# Patient Record
Sex: Female | Born: 1990 | Race: Black or African American | Hispanic: No | Marital: Single | Smoking: Former smoker
Health system: Southern US, Community
[De-identification: ages and names within clinical notes are randomized; demographics above are authoritative.]

## PROBLEM LIST (undated history)

## (undated) DIAGNOSIS — Z789 Other specified health status: Secondary | ICD-10-CM

## (undated) DIAGNOSIS — D649 Anemia, unspecified: Secondary | ICD-10-CM

## (undated) DIAGNOSIS — Z3490 Encounter for supervision of normal pregnancy, unspecified, unspecified trimester: Secondary | ICD-10-CM

## (undated) DIAGNOSIS — A749 Chlamydial infection, unspecified: Secondary | ICD-10-CM

## (undated) DIAGNOSIS — Z309 Encounter for contraceptive management, unspecified: Secondary | ICD-10-CM

## (undated) HISTORY — DX: Other specified health status: Z78.9

## (undated) HISTORY — DX: Encounter for contraceptive management, unspecified: Z30.9

## (undated) HISTORY — DX: Encounter for supervision of normal pregnancy, unspecified, unspecified trimester: Z34.90

## (undated) HISTORY — DX: Anemia, unspecified: D64.9

## (undated) HISTORY — PX: NO PAST SURGERIES: SHX2092

---
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
## 2011-09-14 IMAGING — CR DG CHEST 2V
2 series · 2 of 2 positions shown · non-contrast
Comparison: None.

CLINICAL DATA: Left chest pain and heaviness.

CHEST - 2 VIEW

[view not recorded (1 of 2)]
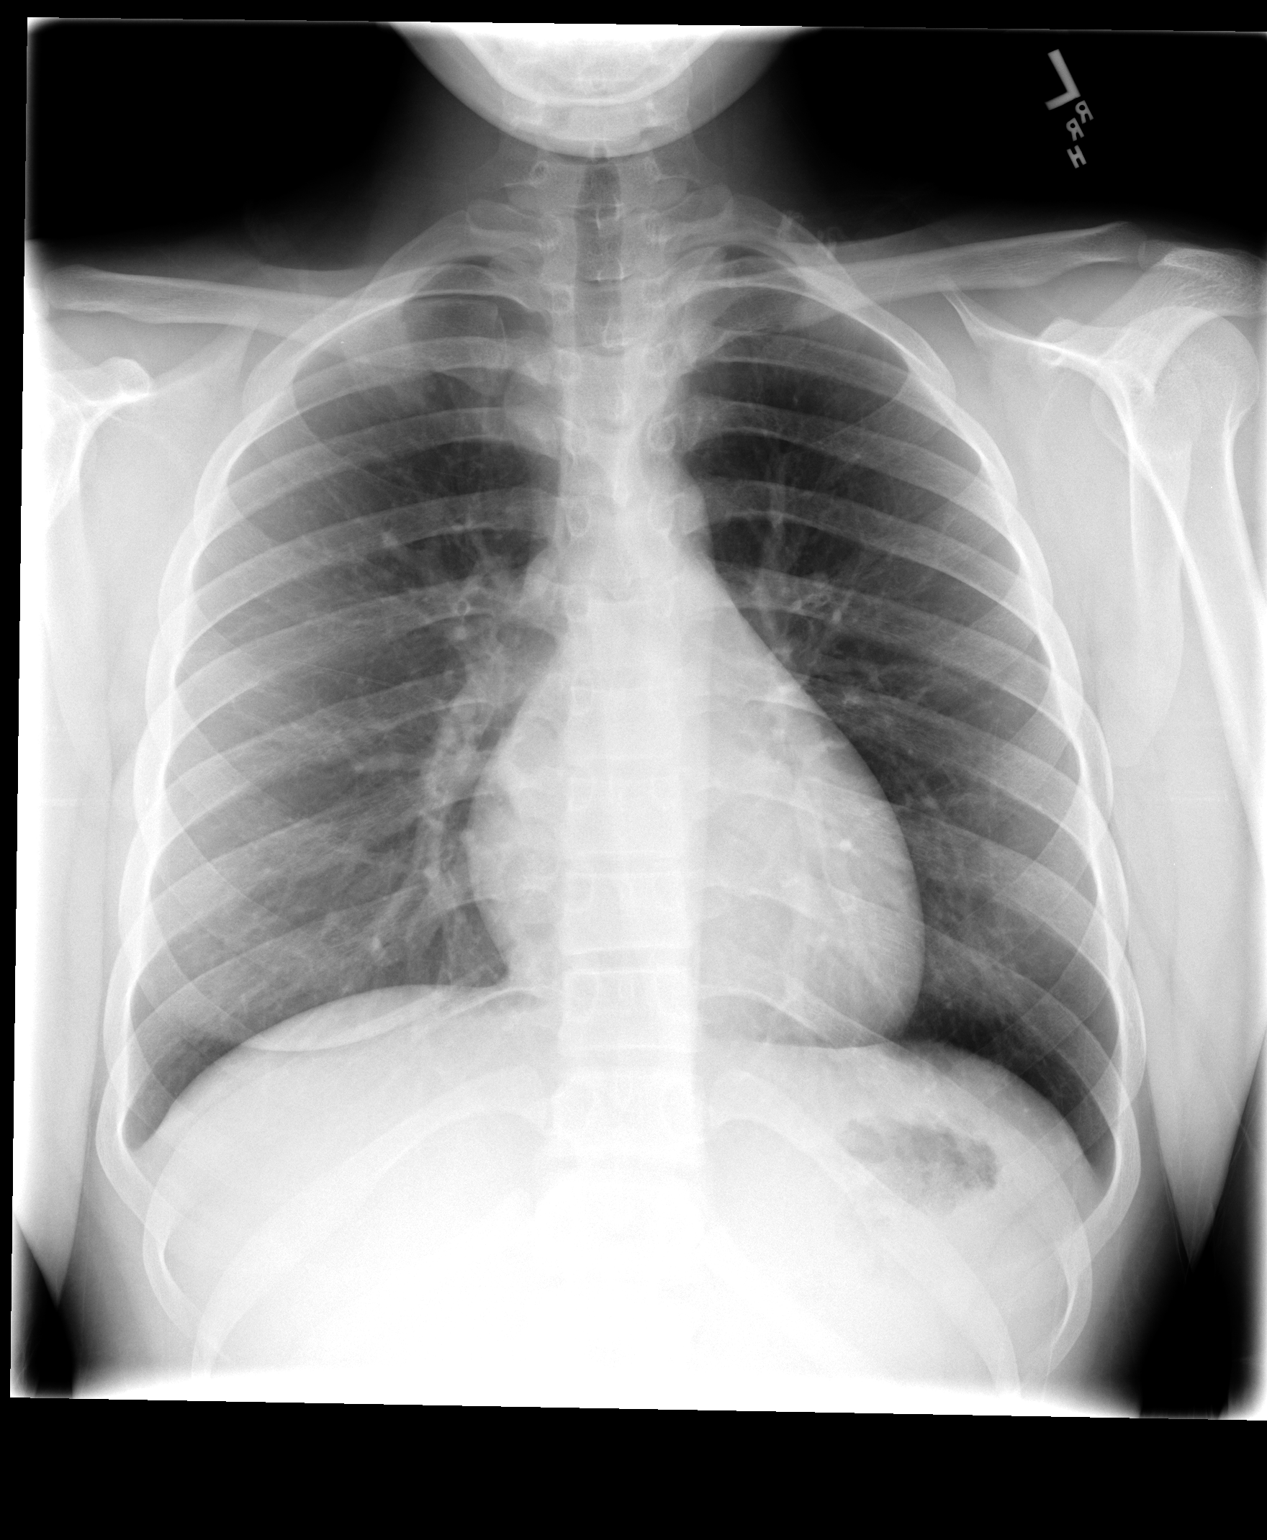

[view not recorded (2 of 2)]
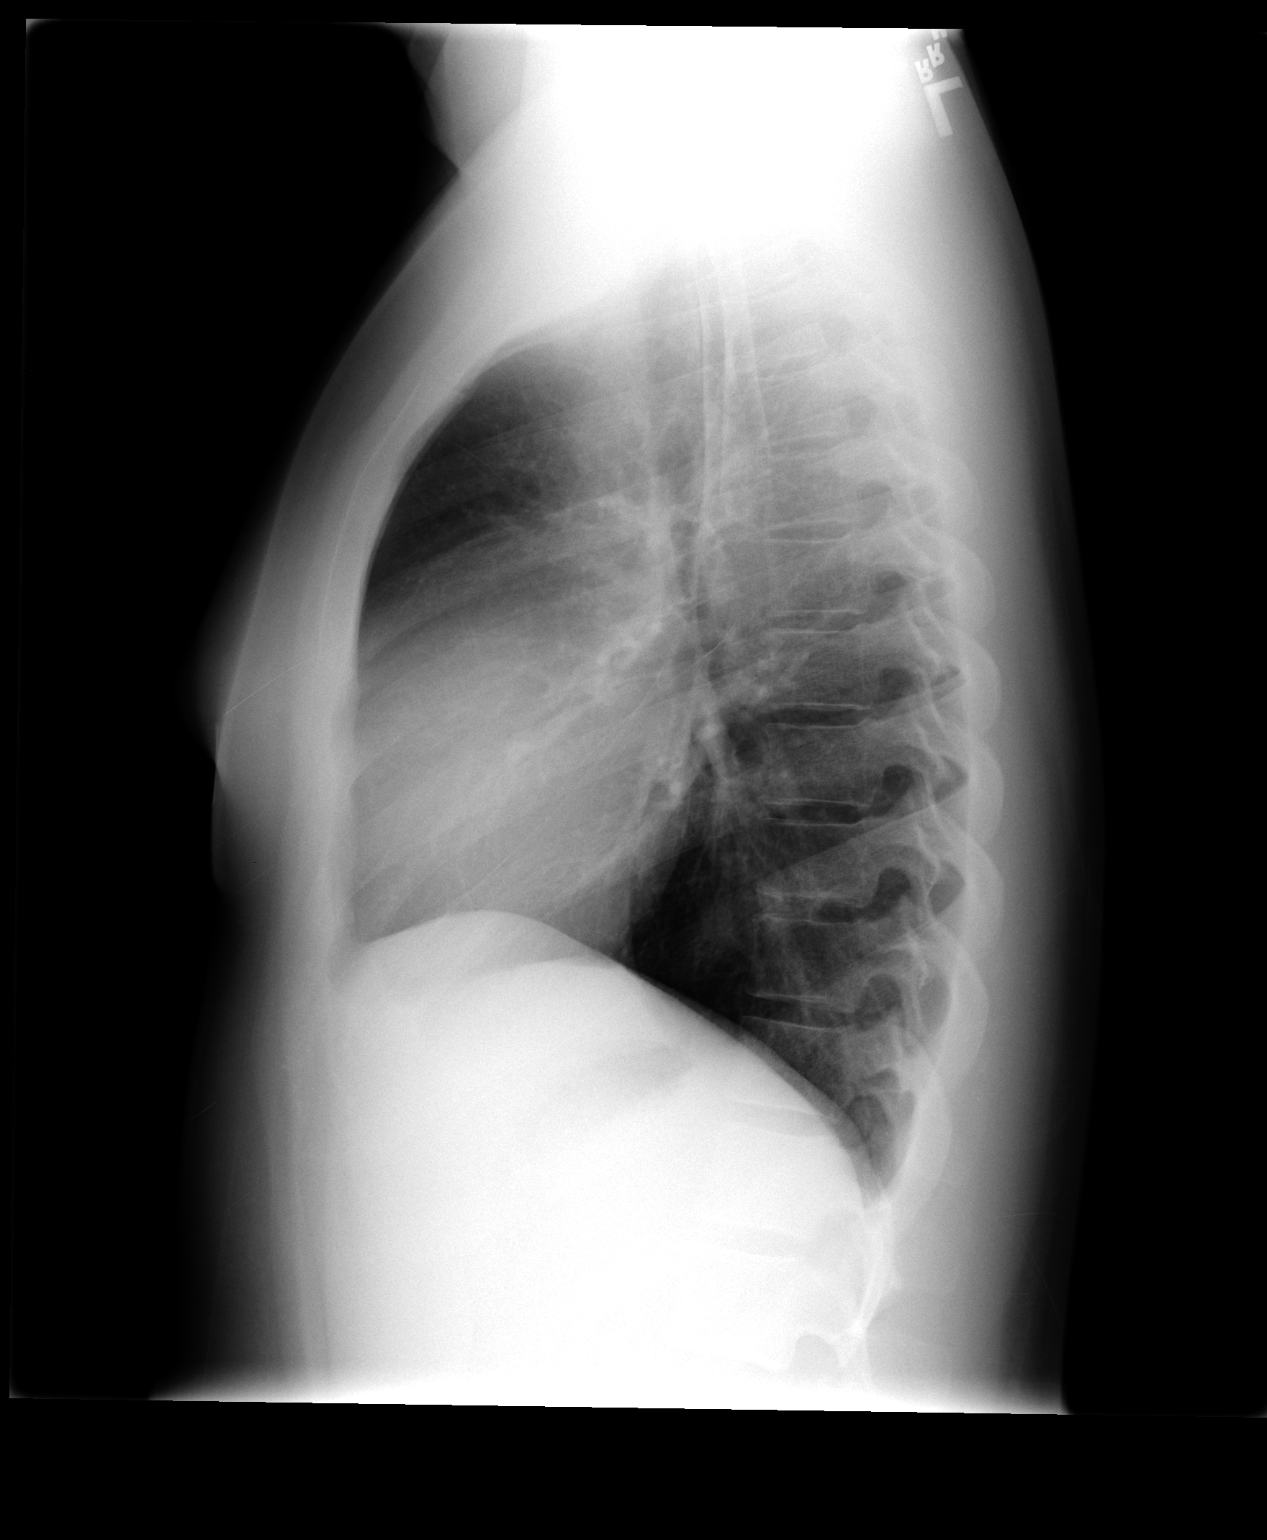

[2 of 2 positions shown; findings below may reference images not displayed]

FINDINGS: Trachea is midline.  Heart size normal.  The lungs are
clear.  No pleural fluid.
IMPRESSION: No acute findings.

## 2013-06-16 ENCOUNTER — Emergency Department (HOSPITAL_COMMUNITY)
Admission: EM | Admit: 2013-06-16 | Discharge: 2013-06-16 | Disposition: A | Discharge status: Home / Self Care | Payer: Medicaid Other | Attending: Emergency Medicine | Admitting: Emergency Medicine

## 2013-06-16 ENCOUNTER — Encounter (HOSPITAL_COMMUNITY): Payer: Self-pay | Admitting: Emergency Medicine

## 2013-06-16 DIAGNOSIS — K089 Disorder of teeth and supporting structures, unspecified: Secondary | ICD-10-CM | POA: Insufficient documentation

## 2013-06-16 DIAGNOSIS — F17200 Nicotine dependence, unspecified, uncomplicated: Secondary | ICD-10-CM | POA: Insufficient documentation

## 2013-06-16 DIAGNOSIS — K0889 Other specified disorders of teeth and supporting structures: Secondary | ICD-10-CM

## 2013-06-16 MED ORDER — HYDROCODONE-ACETAMINOPHEN 5-325 MG PO TABS: 1.0000 | ORAL_TABLET | Freq: Four times a day (QID) | ORAL | Status: DC | PRN

## 2013-06-16 MED ORDER — PENICILLIN V POTASSIUM 500 MG PO TABS: 500.0000 mg | ORAL_TABLET | Freq: Three times a day (TID) | ORAL | Status: DC

## 2013-06-16 NOTE — ED Provider Notes (Signed)
CSN: [id number]     Arrival date & time 06/16/13  1810 History   First MD Initiated Contact with Patient 06/16/13 1931     Chief Complaint  Patient presents with  . Dental Pain   (Consider location/radiation/quality/duration/timing/severity/associated sxs/prior Treatment) Patient is a 23 y.o. female presenting with tooth pain. The history is provided by the patient. No language interpreter was used.  Dental Pain Location:  Lower Quality:  Throbbing Severity:  Severe Onset quality:  Gradual Duration:  2 weeks Timing:  Constant Progression:  Worsening Chronicity:  New Context: dental caries   Previous work-up:  Filled cavity Ineffective treatments:  NSAIDs Associated symptoms: no difficulty swallowing     History reviewed. No pertinent past medical history. History reviewed. No pertinent past surgical history. History reviewed. No pertinent family history. History  Substance Use Topics  . Smoking status: Current Every Day Smoker  . Smokeless tobacco: Not on file  . Alcohol Use: Yes   OB History   Grav Para Term Preterm Abortions TAB SAB Ect Mult Living                 Review of Systems  HENT: Positive for dental problem.   All other systems reviewed and are negative.    Allergies  Review of patient's allergies indicates no known allergies.  Home Medications  No current outpatient prescriptions on file. BP 123/87  Pulse 67  Temp(Src) 98.6 F (37 C) (Oral)  Resp 18  Ht 5\' 7"  (1.702 m)  Wt 153 lb (69.4 kg)  BMI 23.96 kg/m2  SpO2 99%  LMP 06/14/2013 Physical Exam  Nursing note and vitals reviewed. Constitutional: She is oriented to person, place, and time. She appears well-developed and well-nourished.  HENT:  Head: Normocephalic.  Mouth/Throat:    Eyes: Pupils are equal, round, and reactive to light.  Neck: Normal range of motion.  Cardiovascular: Normal rate.   Pulmonary/Chest: Effort normal and breath sounds normal.  Abdominal: Soft.    Musculoskeletal: Normal range of motion.  Lymphadenopathy:    She has no cervical adenopathy.  Neurological: She is alert and oriented to person, place, and time.  Skin: Skin is warm and dry.  Psychiatric: She has a normal mood and affect. Her behavior is normal. Judgment and thought content normal.    ED Course  Procedures (including critical care time) Labs Review Labs Reviewed - No data to display Imaging Review No results found.  EKG Interpretation   None      Patient has a dentist but has not yet scheduled an appointment.  MDM  Dental pain.    Jimmye Normanavid John Nabeel Gladson, NP 06/16/13 2217

## 2013-06-16 NOTE — Discharge Instructions (Signed)

## 2013-06-16 NOTE — ED Notes (Signed)
Dental pain for 2 weeks.

## 2013-06-16 NOTE — ED Provider Notes (Signed)
Medical screening examination/treatment/procedure(s) were performed by non-physician practitioner and as supervising physician I was immediately available for consultation/collaboration.  EKG Interpretation   None         Elhadji Pecore L Crisol Muecke, MD 06/16/13 2342 

## 2013-12-27 ENCOUNTER — Ambulatory Visit (INDEPENDENT_AMBULATORY_CARE_PROVIDER_SITE_OTHER): Payer: Medicaid Other

## 2013-12-27 ENCOUNTER — Other Ambulatory Visit: Payer: Self-pay | Admitting: Obstetrics & Gynecology

## 2013-12-27 DIAGNOSIS — O3680X1 Pregnancy with inconclusive fetal viability, fetus 1: Secondary | ICD-10-CM

## 2013-12-27 DIAGNOSIS — O3090 Multiple gestation, unspecified, unspecified trimester: Secondary | ICD-10-CM

## 2013-12-27 DIAGNOSIS — O3680X0 Pregnancy with inconclusive fetal viability, not applicable or unspecified: Secondary | ICD-10-CM

## 2013-12-27 NOTE — Progress Notes (Signed)
Dating US completed today. Patient unsure of LMP (sometime in March).  Single, active female fetus in a breech presentation.  Cervix closed measuring 3.1 cm.  Anterior Gr 0 placenta. Fluid is subjectively normal with a SVP of 3.41 cm. FHR 149.  Left ovary appears normal. Right ovary not visualized.  Measurements give an EDD of 06-20-14 (15+0 weeks today).  EFW 105 g (4oz).  Anatomy limited today by early gestational age.

## 2014-01-01 ENCOUNTER — Encounter: Payer: Medicaid Other | Admitting: Women's Health

## 2014-01-01 ENCOUNTER — Encounter: Payer: Self-pay | Admitting: *Deleted

## 2014-01-10 ENCOUNTER — Ambulatory Visit (INDEPENDENT_AMBULATORY_CARE_PROVIDER_SITE_OTHER): Payer: Medicaid Other | Admitting: Women's Health

## 2014-01-10 ENCOUNTER — Other Ambulatory Visit (HOSPITAL_COMMUNITY)
Admission: RE | Admit: 2014-01-10 | Discharge: 2014-01-10 | Disposition: A | Discharge status: Home / Self Care | Payer: Medicaid Other | Source: Ambulatory Visit | Attending: Obstetrics & Gynecology | Admitting: Obstetrics & Gynecology

## 2014-01-10 ENCOUNTER — Encounter: Payer: Self-pay | Admitting: Women's Health

## 2014-01-10 VITALS — BP 108/72 | Ht 61.0 in | Wt 165.0 lb

## 2014-01-10 DIAGNOSIS — Z0184 Encounter for antibody response examination: Secondary | ICD-10-CM

## 2014-01-10 DIAGNOSIS — Z3490 Encounter for supervision of normal pregnancy, unspecified, unspecified trimester: Secondary | ICD-10-CM | POA: Insufficient documentation

## 2014-01-10 DIAGNOSIS — Z130 Encounter for screening for diseases of the blood and blood-forming organs and certain disorders involving the immune mechanism: Secondary | ICD-10-CM

## 2014-01-10 DIAGNOSIS — Z3400 Encounter for supervision of normal first pregnancy, unspecified trimester: Secondary | ICD-10-CM

## 2014-01-10 DIAGNOSIS — Z36 Encounter for antenatal screening of mother: Secondary | ICD-10-CM

## 2014-01-10 DIAGNOSIS — Z1389 Encounter for screening for other disorder: Secondary | ICD-10-CM

## 2014-01-10 DIAGNOSIS — Z01419 Encounter for gynecological examination (general) (routine) without abnormal findings: Secondary | ICD-10-CM

## 2014-01-10 DIAGNOSIS — Z331 Pregnant state, incidental: Secondary | ICD-10-CM

## 2014-01-10 DIAGNOSIS — Z3402 Encounter for supervision of normal first pregnancy, second trimester: Secondary | ICD-10-CM

## 2014-01-10 DIAGNOSIS — Z0189 Encounter for other specified special examinations: Secondary | ICD-10-CM

## 2014-01-10 DIAGNOSIS — Z1159 Encounter for screening for other viral diseases: Secondary | ICD-10-CM

## 2014-01-10 DIAGNOSIS — Z1371 Encounter for nonprocreative screening for genetic disease carrier status: Secondary | ICD-10-CM

## 2014-01-10 LAB — CBC
HCT: 36.1 % (ref 36.0–46.0)
Hemoglobin: 11.6 g/dL — ABNORMAL LOW (ref 12.0–15.0)
MCH: 26.4 pg (ref 26.0–34.0)
MCHC: 32.1 g/dL (ref 30.0–36.0)
MCV: 82.2 fL (ref 78.0–100.0)
Platelets: 326 10*3/uL (ref 150–400)
RBC: 4.39 MIL/uL (ref 3.87–5.11)
RDW: 15.5 % (ref 11.5–15.5)
WBC: 9.1 10*3/uL (ref 4.0–10.5)

## 2014-01-10 NOTE — Patient Instructions (Signed)
Second Trimester of Pregnancy The second trimester is from week 13 through week 28, months 4 through 6. The second trimester is often a time when you feel your best. Your body has also adjusted to being pregnant, and you begin to feel better physically. Usually, morning sickness has lessened or quit completely, you may have more energy, and you may have an increase in appetite. The second trimester is also a time when the fetus is growing rapidly. At the end of the sixth month, the fetus is about 9 inches long and weighs about 1 pounds. You will likely begin to feel the baby move (quickening) between 18 and 20 weeks of the pregnancy. BODY CHANGES Your body goes through many changes during pregnancy. The changes vary from woman to woman.   Your weight will continue to increase. You will notice your lower abdomen bulging out.  You may begin to get stretch marks on your hips, abdomen, and breasts.  You may develop headaches that can be relieved by medicines approved by your health care provider.  You may urinate more often because the fetus is pressing on your bladder.  You may develop or continue to have heartburn as a result of your pregnancy.  You may develop constipation because certain hormones are causing the muscles that push waste through your intestines to slow down.  You may develop hemorrhoids or swollen, bulging veins (varicose veins).  You may have back pain because of the weight gain and pregnancy hormones relaxing your joints between the bones in your pelvis and as a result of a shift in weight and the muscles that support your balance.  Your breasts will continue to grow and be tender.  Your gums may bleed and may be sensitive to brushing and flossing.  Dark spots or blotches (chloasma, mask of pregnancy) may develop on your face. This will likely fade after the baby is born.  A dark line from your belly button to the pubic area (linea nigra) may appear. This will likely fade  after the baby is born.  You may have changes in your hair. These can include thickening of your hair, rapid growth, and changes in texture. Some women also have hair loss during or after pregnancy, or hair that feels dry or thin. Your hair will most likely return to normal after your baby is born. WHAT TO EXPECT AT YOUR PRENATAL VISITS During a routine prenatal visit:  You will be weighed to make sure you and the fetus are growing normally.  Your blood pressure will be taken.  Your abdomen will be measured to track your baby's growth.  The fetal heartbeat will be listened to.  Any test results from the previous visit will be discussed. Your health care provider may ask you:  How you are feeling.  If you are feeling the baby move.  If you have had any abnormal symptoms, such as leaking fluid, bleeding, severe headaches, or abdominal cramping.  If you have any questions. Other tests that may be performed during your second trimester include:  Blood tests that check for:  Low iron levels (anemia).  Gestational diabetes (between 24 and 28 weeks).  Rh antibodies.  Urine tests to check for infections, diabetes, or protein in the urine.  An ultrasound to confirm the proper growth and development of the baby.  An amniocentesis to check for possible genetic problems.  Fetal screens for spina bifida and Down syndrome. HOME CARE INSTRUCTIONS   Avoid all smoking, herbs, alcohol, and unprescribed   drugs. These chemicals affect the formation and growth of the baby.  Follow your health care provider's instructions regarding medicine use. There are medicines that are either safe or unsafe to take during pregnancy.  Exercise only as directed by your health care provider. Experiencing uterine cramps is a good sign to stop exercising.  Continue to eat regular, healthy meals.  Wear a good support bra for breast tenderness.  Do not use hot tubs, steam rooms, or saunas.  Wear your  seat belt at all times when driving.  Avoid raw meat, uncooked cheese, cat litter boxes, and soil used by cats. These carry germs that can cause birth defects in the baby.  Take your prenatal vitamins.  Try taking a stool softener (if your health care provider approves) if you develop constipation. Eat more high-fiber foods, such as fresh vegetables or fruit and whole grains. Drink plenty of fluids to keep your urine clear or pale yellow.  Take warm sitz baths to soothe any pain or discomfort caused by hemorrhoids. Use hemorrhoid cream if your health care provider approves.  If you develop varicose veins, wear support hose. Elevate your feet for 15 minutes, 3-4 times a day. Limit salt in your diet.  Avoid heavy lifting, wear low heel shoes, and practice good posture.  Rest with your legs elevated if you have leg cramps or low back pain.  Visit your dentist if you have not gone yet during your pregnancy. Use a soft toothbrush to brush your teeth and be gentle when you floss.  A sexual relationship may be continued unless your health care provider directs you otherwise.  Continue to go to all your prenatal visits as directed by your health care provider. SEEK MEDICAL CARE IF:   You have dizziness.  You have mild pelvic cramps, pelvic pressure, or nagging pain in the abdominal area.  You have persistent nausea, vomiting, or diarrhea.  You have a bad smelling vaginal discharge.  You have pain with urination. SEEK IMMEDIATE MEDICAL CARE IF:   You have a fever.  You are leaking fluid from your vagina.  You have spotting or bleeding from your vagina.  You have severe abdominal cramping or pain.  You have rapid weight gain or loss.  You have shortness of breath with chest pain.  You notice sudden or extreme swelling of your face, hands, ankles, feet, or legs.  You have not felt your baby move in over an hour.  You have severe headaches that do not go away with  medicine.  You have vision changes. Document Released: 05/05/2001 Document Revised: 05/16/2013 Document Reviewed: 07/12/2012 Exit[employer] Patient Information 2015 [employer]. This information is not intended to replace advice given to you by your health care provider. Make sure you discuss any questions you have with your health care provider.  

## 2014-01-10 NOTE — Progress Notes (Signed)
  Subjective:  Brooke Hunt is a 23 y.o. G1P0 African American female at 5860w0d by 15wk u/s, being seen today for her first obstetrical visit.  Her obstetrical history is significant for smoker-1/2ppd down to 5-6/day, late care @ 17wks.  Pregnancy history fully reviewed.  Patient reports no complaints. Denies vb, cramping, uti s/s, abnormal/malodorous vag d/c, or vulvovaginal itching/irritation.  BP 108/72  Wt 165 lb (74.844 kg)  LMP 08/16/2013  HISTORY: OB History  Gravida Para Term Preterm AB SAB TAB Ectopic Multiple Living  1             # Outcome Date GA Lbr Len/2nd Weight Sex Delivery Anes PTL Lv  1 CUR              Past Medical History  Diagnosis Date  . Medical history non-contributory    Past Surgical History  Procedure Laterality Date  . No past surgeries     Family History  Problem Relation Age of Onset  . Arthritis Maternal Grandmother     Exam   System:     General: Well developed & nourished, no acute distress   Skin: Warm & dry, normal coloration and turgor, no rashes   Neurologic: Alert & oriented, normal mood   Cardiovascular: Regular rate & rhythm   Respiratory: Effort & rate normal, LCTAB, acyanotic   Abdomen: Soft, non tender   Extremities: normal strength, tone   Pelvic Exam:    Perineum: Normal perineum   Vulva: Normal, no lesions   Vagina:  Normal mucosa, normal discharge   Cervix: Normal, bulbous, appears closed   Uterus: Normal size/shape/contour for GA   Thin prep pap smear obtained w/ reflex high risk HPV cotesting FHR: 147 via doppler   Assessment:   Pregnancy: G1P0 Patient Active Problem List   Diagnosis Date Noted  . Supervision of normal first pregnancy 01/10/2014    Priority: High    5860w0d G1P0 New OB visit Smoker Late pnc    Plan:  Initial labs drawn Continue prenatal vitamins Problem list reviewed and updated Reviewed n/v relief measures and warning s/s to report Reviewed recommended weight gain based on  pre-gravid BMI Encouraged well-balanced diet Genetic Screening discussed Quad Screen: requested and ordered Cystic fibrosis screening discussed declined Ultrasound discussed; fetal survey: requested Follow up in 3 weeks for anatomy u/s and visit [facility] completed NFPartnership referral sent  Marge DuncansBooker, Aldair Rickel Randall CNM, [facility]NP-BC 01/10/2014 2:17 PM

## 2014-01-11 LAB — RUBELLA SCREEN: Rubella: 1.12 Index — ABNORMAL HIGH (ref <0.90)

## 2014-01-11 LAB — URINALYSIS, ROUTINE W REFLEX MICROSCOPIC
Bilirubin Urine: NEGATIVE
Glucose, UA: NEGATIVE mg/dL
Hgb urine dipstick: NEGATIVE
Ketones, ur: NEGATIVE mg/dL
Leukocytes, UA: NEGATIVE
Nitrite: NEGATIVE
Protein, ur: NEGATIVE mg/dL
Specific Gravity, Urine: 1.025 (ref 1.005–1.030)
Urobilinogen, UA: 1 mg/dL (ref 0.0–1.0)
pH: 7 (ref 5.0–8.0)

## 2014-01-11 LAB — DRUG SCREEN, URINE, NO CONFIRMATION
Amphetamine Screen, Ur: NEGATIVE
Barbiturate Quant, Ur: NEGATIVE
Benzodiazepines.: NEGATIVE
Cocaine Metabolites: NEGATIVE
Creatinine,U: 227.5 mg/dL
Marijuana Metabolite: POSITIVE — AB
Methadone: NEGATIVE
Opiate Screen, Urine: NEGATIVE
Phencyclidine (PCP): NEGATIVE
Propoxyphene: NEGATIVE

## 2014-01-11 LAB — SICKLE CELL SCREEN: Sickle Cell Screen: NEGATIVE

## 2014-01-11 LAB — CYSTIC FIBROSIS DIAGNOSTIC STUDY

## 2014-01-11 LAB — HIV ANTIBODY (ROUTINE TESTING W REFLEX): HIV 1&2 Ab, 4th Generation: NONREACTIVE

## 2014-01-11 LAB — ABO AND RH: Rh Type: POSITIVE

## 2014-01-11 LAB — ANTIBODY SCREEN: Antibody Screen: NEGATIVE

## 2014-01-11 LAB — OXYCODONE SCREEN, UA, RFLX CONFIRM: Oxycodone Screen, Ur: NEGATIVE ng/mL

## 2014-01-11 LAB — HEPATITIS B SURFACE ANTIGEN: Hepatitis B Surface Ag: NEGATIVE

## 2014-01-11 LAB — RPR

## 2014-01-11 LAB — VARICELLA ZOSTER ANTIBODY, IGG: Varicella IgG: <10 Index (ref <135.00)

## 2014-01-12 LAB — AFP, QUAD SCREEN
AFP: 47.8 IU/mL
Age Alone: 1:1140 {titer}
Curr Gest Age: 17 wks.days
Down Syndrome Scr Risk Est: 1:23300 {titer}
HCG, Total: 35812 m[IU]/mL
INH: 262.3 pg/mL
Interpretation-AFP: NEGATIVE
MoM for AFP: 1.31
MoM for INH: 1.52
MoM for hCG: 1.7
Open Spina bifida: NEGATIVE
Osb Risk: 1:9620 {titer}
Tri 18 Scr Risk Est: NEGATIVE
Trisomy 18 (Edward) Syndrome Interp.: 1:172000 {titer}
uE3 Mom: 1.17
uE3 Value: 0.7 ng/mL

## 2014-01-12 LAB — CYTOLOGY - PAP

## 2014-01-12 LAB — URINE CULTURE: Colony Count: 4000

## 2014-01-20 ENCOUNTER — Encounter: Payer: Self-pay | Admitting: Women's Health

## 2014-01-20 DIAGNOSIS — Z2839 Other underimmunization status: Secondary | ICD-10-CM | POA: Insufficient documentation

## 2014-01-20 DIAGNOSIS — O09899 Supervision of other high risk pregnancies, unspecified trimester: Secondary | ICD-10-CM | POA: Insufficient documentation

## 2014-01-20 DIAGNOSIS — Z283 Underimmunization status: Secondary | ICD-10-CM

## 2014-02-01 ENCOUNTER — Other Ambulatory Visit: Payer: Self-pay | Admitting: Women's Health

## 2014-02-01 ENCOUNTER — Encounter: Payer: Self-pay | Admitting: Advanced Practice Midwife

## 2014-02-01 ENCOUNTER — Ambulatory Visit (INDEPENDENT_AMBULATORY_CARE_PROVIDER_SITE_OTHER): Payer: Medicaid Other | Admitting: Advanced Practice Midwife

## 2014-02-01 ENCOUNTER — Ambulatory Visit (INDEPENDENT_AMBULATORY_CARE_PROVIDER_SITE_OTHER): Payer: Medicaid Other

## 2014-02-01 VITALS — BP 120/84 | Wt 161.0 lb

## 2014-02-01 DIAGNOSIS — Z3402 Encounter for supervision of normal first pregnancy, second trimester: Secondary | ICD-10-CM

## 2014-02-01 DIAGNOSIS — F1210 Cannabis abuse, uncomplicated: Secondary | ICD-10-CM

## 2014-02-01 DIAGNOSIS — O355XX1 Maternal care for (suspected) damage to fetus by drugs, fetus 1: Secondary | ICD-10-CM

## 2014-02-01 DIAGNOSIS — O0932 Supervision of pregnancy with insufficient antenatal care, second trimester: Secondary | ICD-10-CM

## 2014-02-01 DIAGNOSIS — Z1389 Encounter for screening for other disorder: Secondary | ICD-10-CM

## 2014-02-01 DIAGNOSIS — Z3400 Encounter for supervision of normal first pregnancy, unspecified trimester: Secondary | ICD-10-CM

## 2014-02-01 DIAGNOSIS — O3090 Multiple gestation, unspecified, unspecified trimester: Secondary | ICD-10-CM

## 2014-02-01 DIAGNOSIS — Z331 Pregnant state, incidental: Secondary | ICD-10-CM

## 2014-02-01 DIAGNOSIS — O355XX0 Maternal care for (suspected) damage to fetus by drugs, not applicable or unspecified: Secondary | ICD-10-CM

## 2014-02-01 DIAGNOSIS — O0930 Supervision of pregnancy with insufficient antenatal care, unspecified trimester: Secondary | ICD-10-CM

## 2014-02-01 LAB — POCT URINALYSIS DIPSTICK
Blood, UA: NEGATIVE
Blood, UA: NEGATIVE
GLUCOSE UA: NEGATIVE
Glucose, UA: NEGATIVE
Ketones, UA: NEGATIVE
Ketones, UA: NEGATIVE
LEUKOCYTES UA: NEGATIVE
Leukocytes, UA: NEGATIVE
Nitrite, UA: NEGATIVE
PROTEIN UA: NEGATIVE
Protein, UA: NEGATIVE

## 2014-02-01 NOTE — Progress Notes (Signed)
G1P0 [redacted]w[redacted]d Estimated Date of Delivery: 06/20/14  Blood pressure 120/84, weight 161 lb (73.029 kg), last menstrual period 08/16/2013.   BP weight and urine results all reviewed and noted.  Please refer to the obstetrical flow sheet for the fundal height and fetal heart rate documentation: Had anatomy scan today, all normal, female.  Eating a lot more healthy, quit fast food  Patient reports denies any bleeding and no rupture of membranes symptoms or regular contractions. Patient is without complaints. All questions were answered.  Plan:  Continued routine obstetrical care,  Follow up in 4 weeks for OB appointment,

## 2014-02-01 NOTE — Progress Notes (Signed)
U/S(20+1wks)-active fetus, meas c/w dates, fluid WNL, anterior Gr 0 placenta, cx appears closed (3.2cm), bilateral adnexa appears WNL, FHR- 150 bpm, female fetus, no major abnl noted

## 2014-02-01 NOTE — Progress Notes (Signed)
Pt denies any problem or concerns at this time.

## 2014-02-02 LAB — DRUG SCREEN, URINE, NO CONFIRMATION
Amphetamine Screen, Ur: NEGATIVE
BENZODIAZEPINES.: NEGATIVE
Barbiturate Quant, Ur: NEGATIVE
COCAINE METABOLITES: NEGATIVE
Creatinine,U: 108.1 mg/dL
Marijuana Metabolite: POSITIVE — AB
Methadone: NEGATIVE
Opiate Screen, Urine: NEGATIVE
PHENCYCLIDINE (PCP): NEGATIVE
Propoxyphene: NEGATIVE

## 2014-02-02 LAB — GC/CHLAMYDIA PROBE AMP
CT Probe RNA: POSITIVE — AB
GC Probe RNA: NEGATIVE

## 2014-02-06 ENCOUNTER — Other Ambulatory Visit: Payer: Self-pay | Admitting: Advanced Practice Midwife

## 2014-02-06 ENCOUNTER — Encounter: Payer: Self-pay | Admitting: Advanced Practice Midwife

## 2014-02-06 DIAGNOSIS — A749 Chlamydial infection, unspecified: Secondary | ICD-10-CM

## 2014-02-06 MED ORDER — AZITHROMYCIN 500 MG PO TABS: 1000.0000 mg | ORAL_TABLET | Freq: Once | ORAL | Status: DC

## 2014-03-01 ENCOUNTER — Encounter: Payer: Self-pay | Admitting: *Deleted

## 2014-03-01 ENCOUNTER — Encounter: Payer: Medicaid Other | Admitting: Advanced Practice Midwife

## 2014-03-07 ENCOUNTER — Ambulatory Visit (INDEPENDENT_AMBULATORY_CARE_PROVIDER_SITE_OTHER): Payer: Medicaid Other | Admitting: Advanced Practice Midwife

## 2014-03-07 ENCOUNTER — Encounter: Payer: Self-pay | Admitting: Advanced Practice Midwife

## 2014-03-07 VITALS — BP 124/66 | Wt 176.0 lb

## 2014-03-07 DIAGNOSIS — Z1389 Encounter for screening for other disorder: Secondary | ICD-10-CM

## 2014-03-07 DIAGNOSIS — Z8619 Personal history of other infectious and parasitic diseases: Secondary | ICD-10-CM

## 2014-03-07 DIAGNOSIS — Z3402 Encounter for supervision of normal first pregnancy, second trimester: Secondary | ICD-10-CM

## 2014-03-07 DIAGNOSIS — Z331 Pregnant state, incidental: Secondary | ICD-10-CM

## 2014-03-07 LAB — POCT URINALYSIS DIPSTICK
Blood, UA: NEGATIVE
Glucose, UA: NEGATIVE
Ketones, UA: NEGATIVE
Leukocytes, UA: NEGATIVE
Nitrite, UA: NEGATIVE
Protein, UA: NEGATIVE

## 2014-03-07 MED ORDER — OMEPRAZOLE 20 MG PO CPDR: 20.0000 mg | DELAYED_RELEASE_CAPSULE | Freq: Every day | ORAL | Status: DC

## 2014-03-07 NOTE — Progress Notes (Signed)
Pt states that she has been having bad heart burn and reflux. Pt denies any other problems or concerns at this time.

## 2014-03-07 NOTE — Progress Notes (Signed)
G1P0 6058w0d Estimated Date of Delivery: 06/20/14  Blood pressure 124/66, weight 176 lb (79.833 kg), last menstrual period 08/16/2013.   BP weight and urine results all reviewed and noted.  Please refer to the obstetrical flow sheet for the fundal height and fetal heart rate documentation:  Patient reports good fetal movement, denies any bleeding and no rupture of membranes symptoms or regular contractions. Patient c/o heartburn and reflux All questions were answered.  Plan:  Continued routine obstetrical care, prilosce for reflux  Follow up in 3 weeks for OB appointment, PN2

## 2014-03-07 NOTE — Patient Instructions (Signed)
1. Before your test, do not eat or drink anything for 8-10 hours prior to your  appointment (a small amount of water is allowed and you may take any medicines you normally take). Be sure to drink lots of water the day before. 2. When you arrive, your blood will be drawn for a 'fasting' blood sugar level.  Then you will be given a sweetened carbonated beverage to drink. You should  complete drinking this beverage within five minutes. After finishing the  beverage, you will have your blood drawn exactly 1 and 2 hours later. Having  your blood drawn on time is an important part of this test. A total of three blood  samples will be done. 3. The test takes approximately 2  hours. During the test, do not have anything to  eat or drink. Do not smoke, chew gum (not even sugarless gum) or use breath mints.  4. During the test you should remain close by and seated as much as possible and  avoid walking around. You may want to bring a book or something else to  occupy your time.  5. After your test, you may eat and drink as normal. You may want to bring a snack  to eat after the test is finished. Your provider will advise you as to the results of  this test and any follow-up if necessary  You will also be retested for syphilis, HIV and blood levels (anemia):  You were already tested in the first trimester, but [place] recommends retesting.  Additionally, you will be tested for Type 2 Herpes. MOST people do not know that they have genital herpes, as only around 15% of people have outbreaks.  However, it is still transmittable to other people, including the baby (but only during the birth).  If you test positive for Type 2 Herpes, we place you on a medicine called acyclovir the last 6 weeks of your pregnancy to prevent transmission of the virus to the baby during the birth.    If your sugar test is positive for gestational diabetes, you will be given an phone call and further instructions discussed.   We typically do not call patients with positive herpes results, but will discuss it at your next appointment.  If you wish to know all of your test results before your next appointment, feel free to call the office, or look up your test results on Mychart.  (The range that the lab uses for normal values of the sugar test are not necessarily the range that is used for pregnant women; if your results are within the range, they are definitely normal.  However, if a value is deemed "high" by the lab, it may not be too high for a pregnant woman.  We will need to discuss the normal range if your value(s) fall in the "high" category).     Sometime between 27 and 36 weeks, it is recommended that you and anyone who is going to be in close contact with your baby receive the Tdap booster.  You should receive it EACH pregnancy, regardless of when your last booster was.  You may go to the [facility] Department (no appointment necessary) or your Primary Care office to receive the vaccine.  If you do not receive the vaccine prior to delivery, it will be offered in the hospital.  However, if you get it at least 2 weeks prior to delivery, you will have the added advantage of passing the immunity to your baby.   

## 2014-03-08 LAB — GC/CHLAMYDIA PROBE AMP
CT Probe RNA: POSITIVE — AB
GC Probe RNA: NEGATIVE

## 2014-03-13 ENCOUNTER — Encounter: Payer: Self-pay | Admitting: Advanced Practice Midwife

## 2014-03-13 ENCOUNTER — Other Ambulatory Visit: Payer: Self-pay | Admitting: Advanced Practice Midwife

## 2014-03-13 MED ORDER — AZITHROMYCIN 500 MG PO TABS: 1000.0000 mg | ORAL_TABLET | Freq: Once | ORAL | Status: DC

## 2014-03-26 ENCOUNTER — Ambulatory Visit (INDEPENDENT_AMBULATORY_CARE_PROVIDER_SITE_OTHER): Payer: Medicaid Other | Admitting: *Deleted

## 2014-03-26 ENCOUNTER — Ambulatory Visit (INDEPENDENT_AMBULATORY_CARE_PROVIDER_SITE_OTHER): Payer: Medicaid Other | Admitting: Women's Health

## 2014-03-26 ENCOUNTER — Encounter: Payer: Self-pay | Admitting: Women's Health

## 2014-03-26 ENCOUNTER — Other Ambulatory Visit: Payer: Medicaid Other

## 2014-03-26 VITALS — BP 130/74 | Wt 182.0 lb

## 2014-03-26 DIAGNOSIS — Z114 Encounter for screening for human immunodeficiency virus [HIV]: Secondary | ICD-10-CM

## 2014-03-26 DIAGNOSIS — Z0184 Encounter for antibody response examination: Secondary | ICD-10-CM

## 2014-03-26 DIAGNOSIS — Z23 Encounter for immunization: Secondary | ICD-10-CM

## 2014-03-26 DIAGNOSIS — Z131 Encounter for screening for diabetes mellitus: Secondary | ICD-10-CM

## 2014-03-26 DIAGNOSIS — F1290 Cannabis use, unspecified, uncomplicated: Secondary | ICD-10-CM

## 2014-03-26 DIAGNOSIS — Z113 Encounter for screening for infections with a predominantly sexual mode of transmission: Secondary | ICD-10-CM

## 2014-03-26 DIAGNOSIS — Z3402 Encounter for supervision of normal first pregnancy, second trimester: Secondary | ICD-10-CM

## 2014-03-26 DIAGNOSIS — Z1389 Encounter for screening for other disorder: Secondary | ICD-10-CM

## 2014-03-26 DIAGNOSIS — Z331 Pregnant state, incidental: Secondary | ICD-10-CM

## 2014-03-26 DIAGNOSIS — A749 Chlamydial infection, unspecified: Secondary | ICD-10-CM

## 2014-03-26 LAB — CBC
HCT: 32.6 % — ABNORMAL LOW (ref 36.0–46.0)
Hemoglobin: 11 g/dL — ABNORMAL LOW (ref 12.0–15.0)
MCH: 27.6 pg (ref 26.0–34.0)
MCHC: 33.7 g/dL (ref 30.0–36.0)
MCV: 81.9 fL (ref 78.0–100.0)
Platelets: 278 10*3/uL (ref 150–400)
RBC: 3.98 MIL/uL (ref 3.87–5.11)
RDW: 14.9 % (ref 11.5–15.5)
WBC: 15.5 10*3/uL — ABNORMAL HIGH (ref 4.0–10.5)

## 2014-03-26 LAB — POCT URINALYSIS DIPSTICK
Blood, UA: NEGATIVE
GLUCOSE UA: NEGATIVE
KETONES UA: NEGATIVE
Leukocytes, UA: NEGATIVE
Nitrite, UA: NEGATIVE
Protein, UA: NEGATIVE

## 2014-03-26 MED ORDER — OMEPRAZOLE 20 MG PO CPDR: 20.0000 mg | DELAYED_RELEASE_CAPSULE | Freq: Every day | ORAL | Status: DC

## 2014-03-26 NOTE — Patient Instructions (Signed)
[place] Pediatricians:  [facility] [phone]            [facility] [phone]                 [facility] [phone] (usually doesn't accept new patients unless you have family there already, you are always welcome to call and ask)             [facility] ([address] [place]) [phone]   [facility]:   [facility]: [phone]  [facility]: [phone]  Call the office [phone]) or go to [facility] if:  You begin to have strong, frequent contractions  Your water breaks.  Sometimes it is a big gush of fluid, sometimes it is just a trickle that keeps getting your panties wet or running down your legs  You have vaginal bleeding.  It is normal to have a small amount of spotting if your cervix was checked.   You don't feel your baby moving like normal.  If you don't, get you something to eat and drink and lay down and focus on feeling your baby move.  You should feel at least 10 movements in 2 hours.  If you don't, you should call the office or go to [facility].    Tdap Vaccine  It is recommended that you get the Tdap vaccine during the third trimester of EACH pregnancy to help protect your baby from getting pertussis (whooping cough)  27-36 weeks is the BEST time to do this so that you can pass the protection on to your baby. During pregnancy is better than after pregnancy, but if you are unable to get it during pregnancy it will be offered at the hospital.   You can get this vaccine at the health department or your family doctor  Everyone who will be around your baby should also be up-to-date on their vaccines. Adults (who are not pregnant) only need 1 dose of Tdap during adulthood.       Third Trimester of Pregnancy The third trimester is from week 29 through week 42, months 7 through 9. The third trimester is a time when the fetus is  growing rapidly. At the end of the ninth month, the fetus is about 20 inches in length and weighs 6-10 pounds.  BODY CHANGES Your body goes through many changes during pregnancy. The changes vary from woman to woman.  9. Your weight will continue to increase. You can expect to gain 25-35 pounds (11-16 kg) by the end of the pregnancy. 10. You may begin to get stretch marks on your hips, abdomen, and breasts. 11. You may urinate more often because the fetus is moving lower into your pelvis and pressing on your bladder. 12. You may develop or continue to have heartburn as a result of your pregnancy. 13. You may develop constipation because certain hormones are causing the muscles that push waste through your intestines to slow down. 14. You may develop hemorrhoids or swollen, bulging veins (varicose veins). 15. You may have pelvic pain because of the weight gain and pregnancy hormones relaxing your joints between the bones in your pelvis. Backaches may result from overexertion of the muscles supporting your posture. 16. You may have changes in your hair. These can include thickening of your hair, rapid growth, and changes in texture. Some women also have hair loss during or after pregnancy, or hair that feels dry or thin. Your hair will most likely  return to normal after your baby is born. 17. Your breasts will continue to grow and be tender. A yellow discharge may leak from your breasts called colostrum. 18. Your belly button may stick out. 19. You may feel short of breath because of your expanding uterus. 20. You may notice the fetus "dropping," or moving lower in your abdomen. 21. You may have a bloody mucus discharge. This usually occurs a few days to a week before labor begins. 22. Your cervix becomes thin and soft (effaced) near your due date. WHAT TO EXPECT AT YOUR PRENATAL EXAMS  You will have prenatal exams every 2 weeks until week 36. Then, you will have weekly prenatal exams. During a  routine prenatal visit: 3. You will be weighed to make sure you and the fetus are growing normally. 4. Your blood pressure is taken. 5. Your abdomen will be measured to track your baby's growth. 6. The fetal heartbeat will be listened to. 7. Any test results from the previous visit will be discussed. 8. You may have a cervical check near your due date to see if you have effaced. At around 36 weeks, your caregiver will check your cervix. At the same time, your caregiver will also perform a test on the secretions of the vaginal tissue. This test is to determine if a type of bacteria, Group B streptococcus, is present. Your caregiver will explain this further. Your caregiver may ask you:  What your birth plan is.  How you are feeling.  If you are feeling the baby move.  If you have had any abnormal symptoms, such as leaking fluid, bleeding, severe headaches, or abdominal cramping.  If you have any questions. Other tests or screenings that may be performed during your third trimester include:  Blood tests that check for low iron levels (anemia).  Fetal testing to check the health, activity level, and growth of the fetus. Testing is done if you have certain medical conditions or if there are problems during the pregnancy. FALSE LABOR You may feel small, irregular contractions that eventually go away. These are called Braxton Hicks contractions, or false labor. Contractions may last for hours, days, or even weeks before true labor sets in. If contractions come at regular intervals, intensify, or become painful, it is best to be seen by your caregiver.  SIGNS OF LABOR   Menstrual-like cramps.  Contractions that are 5 minutes apart or less.  Contractions that start on the top of the uterus and spread down to the lower abdomen and back.  A sense of increased pelvic pressure or back pain.  A watery or bloody mucus discharge that comes from the vagina. If you have any of these signs before  the 37th week of pregnancy, call your caregiver right away. You need to go to the hospital to get checked immediately. HOME CARE INSTRUCTIONS   Avoid all smoking, herbs, alcohol, and unprescribed drugs. These chemicals affect the formation and growth of the baby.  Follow your caregiver's instructions regarding medicine use. There are medicines that are either safe or unsafe to take during pregnancy.  Exercise only as directed by your caregiver. Experiencing uterine cramps is a good sign to stop exercising.  Continue to eat regular, healthy meals.  Wear a good support bra for breast tenderness.  Do not use hot tubs, steam rooms, or saunas.  Wear your seat belt at all times when driving.  Avoid raw meat, uncooked cheese, cat litter boxes, and soil used by cats. These carry germs  that can cause birth defects in the baby.  Take your prenatal vitamins.  Try taking a stool softener (if your caregiver approves) if you develop constipation. Eat more high-fiber foods, such as fresh vegetables or fruit and whole grains. Drink plenty of fluids to keep your urine clear or pale yellow.  Take warm sitz baths to soothe any pain or discomfort caused by hemorrhoids. Use hemorrhoid cream if your caregiver approves.  If you develop varicose veins, wear support hose. Elevate your feet for 15 minutes, 3-4 times a day. Limit salt in your diet.  Avoid heavy lifting, wear low heal shoes, and practice good posture.  Rest a lot with your legs elevated if you have leg cramps or low back pain.  Visit your dentist if you have not gone during your pregnancy. Use a soft toothbrush to brush your teeth and be gentle when you floss.  A sexual relationship may be continued unless your caregiver directs you otherwise.  Do not travel far distances unless it is absolutely necessary and only with the approval of your caregiver.  Take prenatal classes to understand, practice, and ask questions about the labor and  delivery.  Make a trial run to the hospital.  Pack your hospital bag.  Prepare the baby's nursery.  Continue to go to all your prenatal visits as directed by your caregiver. SEEK MEDICAL CARE IF:  You are unsure if you are in labor or if your water has broken.  You have dizziness.  You have mild pelvic cramps, pelvic pressure, or nagging pain in your abdominal area.  You have persistent nausea, vomiting, or diarrhea.  You have a bad smelling vaginal discharge.  You have pain with urination. SEEK IMMEDIATE MEDICAL CARE IF:   You have a fever.  You are leaking fluid from your vagina.  You have spotting or bleeding from your vagina.  You have severe abdominal cramping or pain.  You have rapid weight loss or gain.  You have shortness of breath with chest pain.  You notice sudden or extreme swelling of your face, hands, ankles, feet, or legs.  You have not felt your baby move in over an hour.  You have severe headaches that do not go away with medicine.  You have vision changes. Document Released: 05/05/2001 Document Revised: 05/16/2013 Document Reviewed: 07/12/2012 [facility] Patient Information 2015 [place] [employer] [place]. This information is not intended to replace advice given to you by your health care provider. Make sure you discuss any questions you have with your health care provider.

## 2014-03-26 NOTE — Progress Notes (Signed)
Low-risk OB appointment G1P0 8379w5d Estimated Date of Delivery: 06/20/14 BP 130/74 mmHg  Wt 182 lb (82.555 kg)  LMP 08/16/2013  BP, weight, and urine reviewed.  Refer to obstetrical flow sheet for FH & FHR.  Reports good fm.  Denies regular uc's, lof, vb, or uti s/s. No complaints.  Pt states partner has never taken meds for CT, called in Rx for Brooke Hunt DOB [birth year] [facility] [place]- stressed importance of him taking meds asap. Pt did take her meds. NO sex until after POC next visit. Doesn't think she wants to go to cb classes, recommended tour. No THC since last visit- advised to remain stopped and will repeat UDS next visit. States pharmacy said they didn't get rx for prilosec last visit, resent today.  Reviewed ptl s/s, fkc.  Recommended Tdap at HD/PCP per CDC guidelines.  Plan:  Continue routine obstetrical care  F/U in 3wks for OB appointment and CT POC, UDS PN2 and flu shot today.

## 2014-03-27 LAB — GLUCOSE TOLERANCE, 2 HOURS W/ 1HR
Glucose, 1 hour: 133 mg/dL (ref 70–170)
Glucose, 2 hour: 131 mg/dL (ref 70–139)
Glucose, Fasting: 73 mg/dL (ref 70–99)

## 2014-03-27 LAB — HIV ANTIBODY (ROUTINE TESTING W REFLEX): HIV 1&2 Ab, 4th Generation: NONREACTIVE

## 2014-03-27 LAB — RPR

## 2014-03-27 LAB — ANTIBODY SCREEN: Antibody Screen: NEGATIVE

## 2014-03-28 LAB — HSV 2 ANTIBODY, IGG: HSV 2 Glycoprotein G Ab, IgG: <0.1 IV

## 2014-04-16 ENCOUNTER — Encounter: Payer: Self-pay | Admitting: Women's Health

## 2014-04-16 ENCOUNTER — Ambulatory Visit (INDEPENDENT_AMBULATORY_CARE_PROVIDER_SITE_OTHER): Payer: Medicaid Other | Admitting: Women's Health

## 2014-04-16 VITALS — BP 136/72 | Wt 182.0 lb

## 2014-04-16 DIAGNOSIS — A749 Chlamydial infection, unspecified: Secondary | ICD-10-CM

## 2014-04-16 DIAGNOSIS — Z331 Pregnant state, incidental: Secondary | ICD-10-CM

## 2014-04-16 DIAGNOSIS — O98813 Other maternal infectious and parasitic diseases complicating pregnancy, third trimester: Secondary | ICD-10-CM

## 2014-04-16 DIAGNOSIS — Z1389 Encounter for screening for other disorder: Secondary | ICD-10-CM

## 2014-04-16 DIAGNOSIS — F1290 Cannabis use, unspecified, uncomplicated: Secondary | ICD-10-CM

## 2014-04-16 DIAGNOSIS — O98313 Other infections with a predominantly sexual mode of transmission complicating pregnancy, third trimester: Secondary | ICD-10-CM

## 2014-04-16 LAB — POCT URINALYSIS DIPSTICK
Blood, UA: NEGATIVE
GLUCOSE UA: NEGATIVE
KETONES UA: NEGATIVE
Leukocytes, UA: NEGATIVE
Nitrite, UA: NEGATIVE
Protein, UA: NEGATIVE

## 2014-04-16 NOTE — Progress Notes (Signed)
Low-risk OB appointment G1P0 6953w5d Estimated Date of Delivery: 06/20/14 BP 136/72 mmHg  Wt 182 lb (82.555 kg)  LMP 08/16/2013  BP, weight, and urine reviewed.  Refer to obstetrical flow sheet for FH & FHR.  Reports good fm.  Denies regular uc's, lof, vb, or uti s/s. Pain RUQ when coughing/sneezing/bending over, no recent URI, denies ha, scotomata, ruq/epigastric pain, n/v-sounds like costochondritis.  Partner still has not taken his zithromax for +CT, will do pt's POC today- she is not to have sex w/ him until at least 7d after he takes his meds. Will check uds today, no thc since prior to last visit.  Reviewed pn2 results, ptl s/s, fkc. Plan:  Continue routine obstetrical care  F/U in 2wks for OB appointment

## 2014-04-16 NOTE — Patient Instructions (Signed)
NO SEX UNTIL AT LEAST 7 DAYS AFTER YOUR PARTNER HAS TAKEN HIS MEDICINE  Call the office [phone]) or go to [facility] if:  You begin to have strong, frequent contractions  Your water breaks.  Sometimes it is a big gush of fluid, sometimes it is just a trickle that keeps getting your panties wet or running down your legs  You have vaginal bleeding.  It is normal to have a small amount of spotting if your cervix was checked.   You don't feel your baby moving like normal.  If you don't, get you something to eat and drink and lay down and focus on feeling your baby move.  You should feel at least 10 movements in 2 hours.  If you don't, you should call the office or go to [facility].    Third Trimester of Pregnancy The third trimester is from week 29 through week 42, months 7 through 9. The third trimester is a time when the fetus is growing rapidly. At the end of the ninth month, the fetus is about 20 inches in length and weighs 6-10 pounds.  BODY CHANGES Your body goes through many changes during pregnancy. The changes vary from woman to woman.   Your weight will continue to increase. You can expect to gain 25-35 pounds (11-16 kg) by the end of the pregnancy.  You may begin to get stretch marks on your hips, abdomen, and breasts.  You may urinate more often because the fetus is moving lower into your pelvis and pressing on your bladder.  You may develop or continue to have heartburn as a result of your pregnancy.  You may develop constipation because certain hormones are causing the muscles that push waste through your intestines to slow down.  You may develop hemorrhoids or swollen, bulging veins (varicose veins).  You may have pelvic pain because of the weight gain and pregnancy hormones relaxing your joints between the bones in your pelvis. Backaches may result from overexertion of the muscles supporting your posture.  You may have changes in your hair. These can include  thickening of your hair, rapid growth, and changes in texture. Some women also have hair loss during or after pregnancy, or hair that feels dry or thin. Your hair will most likely return to normal after your baby is born.  Your breasts will continue to grow and be tender. A yellow discharge may leak from your breasts called colostrum.  Your belly button may stick out.  You may feel short of breath because of your expanding uterus.  You may notice the fetus "dropping," or moving lower in your abdomen.  You may have a bloody mucus discharge. This usually occurs a few days to a week before labor begins.  Your cervix becomes thin and soft (effaced) near your due date. WHAT TO EXPECT AT YOUR PRENATAL EXAMS  You will have prenatal exams every 2 weeks until week 36. Then, you will have weekly prenatal exams. During a routine prenatal visit:  You will be weighed to make sure you and the fetus are growing normally.  Your blood pressure is taken.  Your abdomen will be measured to track your baby's growth.  The fetal heartbeat will be listened to.  Any test results from the previous visit will be discussed.  You may have a cervical check near your due date to see if you have effaced. At around 36 weeks, your caregiver will check your cervix. At the same time, your caregiver will  also perform a test on the secretions of the vaginal tissue. This test is to determine if a type of bacteria, Group B streptococcus, is present. Your caregiver will explain this further. Your caregiver may ask you:  What your birth plan is.  How you are feeling.  If you are feeling the baby move.  If you have had any abnormal symptoms, such as leaking fluid, bleeding, severe headaches, or abdominal cramping.  If you have any questions. Other tests or screenings that may be performed during your third trimester include:  Blood tests that check for low iron levels (anemia).  Fetal testing to check the health,  activity level, and growth of the fetus. Testing is done if you have certain medical conditions or if there are problems during the pregnancy. FALSE LABOR You may feel small, irregular contractions that eventually go away. These are called Braxton Hicks contractions, or false labor. Contractions may last for hours, days, or even weeks before true labor sets in. If contractions come at regular intervals, intensify, or become painful, it is best to be seen by your caregiver.  SIGNS OF LABOR   Menstrual-like cramps.  Contractions that are 5 minutes apart or less.  Contractions that start on the top of the uterus and spread down to the lower abdomen and back.  A sense of increased pelvic pressure or back pain.  A watery or bloody mucus discharge that comes from the vagina. If you have any of these signs before the 37th week of pregnancy, call your caregiver right away. You need to go to the hospital to get checked immediately. HOME CARE INSTRUCTIONS   Avoid all smoking, herbs, alcohol, and unprescribed drugs. These chemicals affect the formation and growth of the baby.  Follow your caregiver's instructions regarding medicine use. There are medicines that are either safe or unsafe to take during pregnancy.  Exercise only as directed by your caregiver. Experiencing uterine cramps is a good sign to stop exercising.  Continue to eat regular, healthy meals.  Wear a good support bra for breast tenderness.  Do not use hot tubs, steam rooms, or saunas.  Wear your seat belt at all times when driving.  Avoid raw meat, uncooked cheese, cat litter boxes, and soil used by cats. These carry germs that can cause birth defects in the baby.  Take your prenatal vitamins.  Try taking a stool softener (if your caregiver approves) if you develop constipation. Eat more high-fiber foods, such as fresh vegetables or fruit and whole grains. Drink plenty of fluids to keep your urine clear or pale  yellow.  Take warm sitz baths to soothe any pain or discomfort caused by hemorrhoids. Use hemorrhoid cream if your caregiver approves.  If you develop varicose veins, wear support hose. Elevate your feet for 15 minutes, 3-4 times a day. Limit salt in your diet.  Avoid heavy lifting, wear low heal shoes, and practice good posture.  Rest a lot with your legs elevated if you have leg cramps or low back pain.  Visit your dentist if you have not gone during your pregnancy. Use a soft toothbrush to brush your teeth and be gentle when you floss.  A sexual relationship may be continued unless your caregiver directs you otherwise.  Do not travel far distances unless it is absolutely necessary and only with the approval of your caregiver.  Take prenatal classes to understand, practice, and ask questions about the labor and delivery.  Make a trial run to the hospital.  Pack your hospital bag.  Prepare the baby's nursery.  Continue to go to all your prenatal visits as directed by your caregiver. SEEK MEDICAL CARE IF:  You are unsure if you are in labor or if your water has broken.  You have dizziness.  You have mild pelvic cramps, pelvic pressure, or nagging pain in your abdominal area.  You have persistent nausea, vomiting, or diarrhea.  You have a bad smelling vaginal discharge.  You have pain with urination. SEEK IMMEDIATE MEDICAL CARE IF:   You have a fever.  You are leaking fluid from your vagina.  You have spotting or bleeding from your vagina.  You have severe abdominal cramping or pain.  You have rapid weight loss or gain.  You have shortness of breath with chest pain.  You notice sudden or extreme swelling of your face, hands, ankles, feet, or legs.  You have not felt your baby move in over an hour.  You have severe headaches that do not go away with medicine.  You have vision changes. Document Released: 05/05/2001 Document Revised: 05/16/2013 Document  Reviewed: 07/12/2012 [facility] Patient Information 2015 [place], [place]. This information is not intended to replace advice given to you by your health care provider. Make sure you discuss any questions you have with your health care provider.

## 2014-04-17 LAB — DRUG SCREEN, URINE, NO CONFIRMATION
Amphetamine Screen, Ur: NEGATIVE
BARBITURATE QUANT UR: NEGATIVE
Benzodiazepines.: NEGATIVE
COCAINE METABOLITES: NEGATIVE
CREATININE, U: 8 mg/dL
Marijuana Metabolite: POSITIVE — AB
Methadone: NEGATIVE
Opiate Screen, Urine: NEGATIVE
PHENCYCLIDINE (PCP): NEGATIVE
Propoxyphene: NEGATIVE

## 2014-04-17 LAB — GC/CHLAMYDIA PROBE AMP
CT PROBE, AMP APTIMA: NEGATIVE
GC Probe RNA: NEGATIVE

## 2014-05-01 ENCOUNTER — Ambulatory Visit (INDEPENDENT_AMBULATORY_CARE_PROVIDER_SITE_OTHER): Payer: Medicaid Other | Admitting: Advanced Practice Midwife

## 2014-05-01 VITALS — BP 120/70 | Wt 183.4 lb

## 2014-05-01 DIAGNOSIS — Z1389 Encounter for screening for other disorder: Secondary | ICD-10-CM

## 2014-05-01 DIAGNOSIS — Z331 Pregnant state, incidental: Secondary | ICD-10-CM

## 2014-05-01 DIAGNOSIS — Z3403 Encounter for supervision of normal first pregnancy, third trimester: Secondary | ICD-10-CM

## 2014-05-01 LAB — POCT URINALYSIS DIPSTICK
Glucose, UA: NEGATIVE
Ketones, UA: NEGATIVE
LEUKOCYTES UA: NEGATIVE
NITRITE UA: NEGATIVE
Protein, UA: NEGATIVE

## 2014-05-01 NOTE — Progress Notes (Signed)
G1P0 4554w6d Estimated Date of Delivery: 06/20/14  Last menstrual period 08/16/2013.   BP weight and urine results all reviewed and noted.  Please refer to the obstetrical flow sheet for the fundal height and fetal heart rate documentation:  Patient reports good fetal movement, denies any bleeding and no rupture of membranes symptoms or regular contractions. Patient is without complaints. All questions were answered.  Plan:  Continued routine obstetrical care,   Follow up in 2 weeks for OB appointment,

## 2014-05-16 ENCOUNTER — Encounter: Payer: Self-pay | Admitting: Women's Health

## 2014-05-16 ENCOUNTER — Ambulatory Visit (INDEPENDENT_AMBULATORY_CARE_PROVIDER_SITE_OTHER): Payer: Medicaid Other | Admitting: Women's Health

## 2014-05-16 VITALS — BP 118/70 | Temp 97.7°F | Wt 189.0 lb

## 2014-05-16 DIAGNOSIS — Z331 Pregnant state, incidental: Secondary | ICD-10-CM

## 2014-05-16 DIAGNOSIS — Z1389 Encounter for screening for other disorder: Secondary | ICD-10-CM

## 2014-05-16 DIAGNOSIS — Z3403 Encounter for supervision of normal first pregnancy, third trimester: Secondary | ICD-10-CM

## 2014-05-16 DIAGNOSIS — J069 Acute upper respiratory infection, unspecified: Secondary | ICD-10-CM

## 2014-05-16 LAB — POCT URINALYSIS DIPSTICK
GLUCOSE UA: NEGATIVE
Ketones, UA: NEGATIVE
NITRITE UA: NEGATIVE
Protein, UA: NEGATIVE
RBC UA: NEGATIVE

## 2014-05-16 NOTE — Progress Notes (Signed)
Low-risk OB appointment G1P0 4951w0d Estimated Date of Delivery: 06/20/14 BP 118/70 mmHg  Temp(Src) 97.7 F (36.5 C)  Wt 189 lb (85.73 kg)  LMP 08/16/2013  BP, weight, and urine reviewed.  Refer to obstetrical flow sheet for FH & FHR.  Reports good fm.  Denies regular uc's, lof, vb, or uti s/s. Continued tooth pain, never saw dentist from earlier in pregnancy- gave name and number and told her to call today to get appt scheduled.  Cold x 2-3 days, discussed relief measures.  Reviewed ptl s/s, fkc. Plan:  Continue routine obstetrical care  F/U in 2wks for OB appointment & GBS

## 2014-05-16 NOTE — Patient Instructions (Addendum)
MAKE AN APPOINTMENT WITH A DENTIST ASAP!!! Dr. Blondell RevealJudkins Sidney Ace([place]) [phone]  You have a viral infection that will resolve on its own over time.  Symptoms typically last 3-7 days but can stretch out to 2-3 weeks.  Unfortunately, antibiotics are not helpful for viral infections.  Humidifier and saline nasal spray for nasal congestion  Regular robitussin, cough drops for cough  Warm salt water gargles for sore throat  Mucinex with lots of water to help you cough up the mucous in your chest if needed  Drink plenty of fluids and stay hydrated!  Wash your hands frequently.  Call if you are not improving by 7-10 days.    Call the office [phone]) or go to [facility] if:  You begin to have strong, frequent contractions  Your water breaks.  Sometimes it is a big gush of fluid, sometimes it is just a trickle that keeps getting your panties wet or running down your legs  You have vaginal bleeding.  It is normal to have a small amount of spotting if your cervix was checked.   You don't feel your baby moving like normal.  If you don't, get you something to eat and drink and lay down and focus on feeling your baby move.  You should feel at least 10 movements in 2 hours.  If you don't, you should call the office or go to [facility].     [place] Pediatricians:  [facility] [phone]            [facility] [phone]                 [facility] [phone] (usually doesn't accept new patients unless you have family there already, you are always welcome to call and ask)             [facility] ([address] [place]) [phone]   [facility]:   [facility]: [phone]  [facility]: [phone]    Preterm Labor Information Preterm labor is when labor starts at less than 37 weeks of pregnancy. The normal length of a pregnancy is 39 to 41  weeks. CAUSES Often, there is no identifiable underlying cause as to why a woman goes into preterm labor. One of the most common known causes of preterm labor is infection. Infections of the uterus, cervix, vagina, amniotic sac, bladder, kidney, or even the lungs (pneumonia) can cause labor to start. Other suspected causes of preterm labor include:  13. Urogenital infections, such as yeast infections and bacterial vaginosis.  14. Uterine abnormalities (uterine shape, uterine septum, fibroids, or bleeding from the placenta).  15. A cervix that has been operated on (it may fail to stay closed).  16. Malformations in the fetus.  17. Multiple gestations (twins, triplets, and so on).  18. Breakage of the amniotic sac.  RISK FACTORS 2. Having a previous history of preterm labor.  3. Having premature rupture of membranes (PROM).  4. Having a placenta that covers the opening of the cervix (placenta previa).  5. Having a placenta that separates from the uterus (placental abruption).  6. Having a cervix that is too weak to hold the fetus in the uterus (incompetent cervix).  7. Having too much fluid in the amniotic sac (polyhydramnios).  8. Taking illegal drugs or smoking while pregnant.  9. Not gaining enough weight while pregnant.  10. Being younger than 7718 and older than 22 years old.  11. Having a low  socioeconomic status.  12. Being African American. SYMPTOMS Signs and symptoms of preterm labor include:  2. Menstrual-like cramps, abdominal pain, or back pain. 3. Uterine contractions that are regular, as frequent as six in an hour, regardless of their intensity (may be mild or painful). 4. Contractions that start on the top of the uterus and spread down to the lower abdomen and back.  5. A sense of increased pelvic pressure.  6. A watery or bloody mucus discharge that comes from the vagina.  TREATMENT Depending on the length of the pregnancy and other circumstances, your  health care provider may suggest bed rest. If necessary, there are medicines that can be given to stop contractions and to mature the fetal lungs. If labor happens before 34 weeks of pregnancy, a prolonged hospital stay may be recommended. Treatment depends on the condition of both you and the fetus.  WHAT SHOULD YOU DO IF YOU THINK YOU ARE IN PRETERM LABOR? Call your health care provider right away. You will need to go to the hospital to get checked immediately. HOW CAN YOU PREVENT PRETERM LABOR IN FUTURE PREGNANCIES? You should:  2. Stop smoking if you smoke. 3. Maintain healthy weight gain and avoid chemicals and drugs that are not necessary. 4. Be watchful for any type of infection. 5. Inform your health care provider if you have a known history of preterm labor. Document Released: 08/01/2003 Document Revised: 01/11/2013 Document Reviewed: 06/13/2012 [facility] Patient Information 2015 [employer] [place]. This information is not intended to replace advice given to you by your health care provider. Make sure you discuss any questions you have with your health care provider.

## 2014-05-25 NOTE — L&D Delivery Note (Addendum)
Delivery Note At 4:47 PM a viable female was delivered via Vaginal, Spontaneous Delivery (Presentation: Right Occiput Anterior).  APGAR: 8/9; weight pending.   Placenta status: Intact, Spontaneous.  Cord: 3 vessels with the following complications: no .  Anesthesia:  none Episiotomy: None Lacerations:  1st degree vaginal, hemostatic & not repaired Suture Repair: none Est. Blood Loss (mL):  200  Mom to postpartum.  Baby to Couplet care / Skin to Skin.  Delivery supervised by Ferdie PingMarie Darlene Lawson, CNM  Felton Clintonoss,Lisa Wynne 06/17/2014, 5:13 PM

## 2014-05-30 ENCOUNTER — Encounter: Payer: Self-pay | Admitting: Women's Health

## 2014-05-30 ENCOUNTER — Ambulatory Visit (INDEPENDENT_AMBULATORY_CARE_PROVIDER_SITE_OTHER): Payer: Medicaid Other | Admitting: Women's Health

## 2014-05-30 VITALS — BP 126/64 | Wt 186.0 lb

## 2014-05-30 DIAGNOSIS — Z1389 Encounter for screening for other disorder: Secondary | ICD-10-CM

## 2014-05-30 DIAGNOSIS — Z1159 Encounter for screening for other viral diseases: Secondary | ICD-10-CM

## 2014-05-30 DIAGNOSIS — Z3685 Encounter for antenatal screening for Streptococcus B: Secondary | ICD-10-CM

## 2014-05-30 DIAGNOSIS — Z118 Encounter for screening for other infectious and parasitic diseases: Secondary | ICD-10-CM

## 2014-05-30 DIAGNOSIS — Z3403 Encounter for supervision of normal first pregnancy, third trimester: Secondary | ICD-10-CM

## 2014-05-30 DIAGNOSIS — Z331 Pregnant state, incidental: Secondary | ICD-10-CM

## 2014-05-30 LAB — POCT URINALYSIS DIPSTICK
Blood, UA: NEGATIVE
GLUCOSE UA: NEGATIVE
KETONES UA: NEGATIVE
Nitrite, UA: NEGATIVE
Protein, UA: NEGATIVE

## 2014-05-30 LAB — OB RESULTS CONSOLE GC/CHLAMYDIA: Chlamydia: NEGATIVE

## 2014-05-30 LAB — OB RESULTS CONSOLE RPR: RPR: NONREACTIVE

## 2014-05-30 NOTE — Patient Instructions (Signed)
Call the office ([phone]) or go [facility] if:  You begin to have strong, frequent contractions  Your water breaks.  Sometimes it is a big gush of fluid, sometimes it is just a trickle that keeps getting your panties wet or running down your legs  You have vaginal bleeding.  It is normal to have a small amount of spotting if your cervix was checked.   You don't feel your baby moving like normal.  If you don't, get you something to eat and drink and lay down and focus on feeling your baby move.  You should feel at least 10 movements in 2 hours.  If you don't, you should call the office or go to [facility].    Braxton Hicks Contractions Contractions of the uterus can occur throughout pregnancy. Contractions are not always a sign that you are in labor.  WHAT ARE BRAXTON HICKS CONTRACTIONS?  Contractions that occur before labor are called Braxton Hicks contractions, or false labor. Toward the end of pregnancy (32-34 weeks), these contractions can develop more often and may become more forceful. This is not true labor because these contractions do not result in opening (dilatation) and thinning of the cervix. They are sometimes difficult to tell apart from true labor because these contractions can be forceful and people have different pain tolerances. You should not feel embarrassed if you go to the hospital with false labor. Sometimes, the only way to tell if you are in true labor is for your health care provider to look for changes in the cervix. If there are no prenatal problems or other health problems associated with the pregnancy, it is completely safe to be sent home with false labor and await the onset of true labor. HOW CAN YOU TELL THE DIFFERENCE BETWEEN TRUE AND FALSE LABOR? False Labor  The contractions of false labor are usually shorter and not as hard as those of true labor.   The contractions are usually irregular.   The contractions are often felt in the front of  the lower abdomen and in the groin.   The contractions may go away when you walk around or change positions while lying down.   The contractions get weaker and are shorter lasting as time goes on.   The contractions do not usually become progressively stronger, regular, and closer together as with true labor.  True Labor  Contractions in true labor last 30-70 seconds, become very regular, usually become more intense, and increase in frequency.   The contractions do not go away with walking.   The discomfort is usually felt in the top of the uterus and spreads to the lower abdomen and low back.   True labor can be determined by your health care provider with an exam. This will show that the cervix is dilating and getting thinner.  WHAT TO REMEMBER  Keep up with your usual exercises and follow other instructions given by your health care provider.   Take medicines as directed by your health care provider.   Keep your regular prenatal appointments.   Eat and drink lightly if you think you are going into labor.   If Braxton Hicks contractions are making you uncomfortable:   Change your position from lying down or resting to walking, or from walking to resting.   Sit and rest in a tub of warm water.   Drink 2-3 glasses of water. Dehydration may cause these contractions.   Do slow and deep breathing several times an hour.    WHEN SHOULD I SEEK IMMEDIATE MEDICAL CARE? Seek immediate medical care if:  Your contractions become stronger, more regular, and closer together.   You have fluid leaking or gushing from your vagina.   You have a fever.   You pass blood-tinged mucus.   You have vaginal bleeding.   You have continuous abdominal pain.   You have low back pain that you never had before.   You feel your baby's head pushing down and causing pelvic pressure.   Your baby is not moving as much as it used to.  Document Released: 05/11/2005 Document  Revised: 05/16/2013 Document Reviewed: 02/20/2013 ExitCare Patient Information 2015 Exit[employer], LLC. This information is not intended to replace advice given to you by your health care provider. Make sure you discuss any questions you have with your health care provider.  

## 2014-05-30 NOTE — Progress Notes (Signed)
Low-risk OB appointment G1P0 7161w0d Estimated Date of Delivery: 06/20/14 BP 126/64 mmHg  Wt 186 lb (84.369 kg)  LMP 08/16/2013  BP, weight, and urine reviewed.  Refer to obstetrical flow sheet for FH & FHR.  Reports good fm.  Denies regular uc's, lof, vb, or uti s/s. No complaints. GBS collected SVE: cl/th/high, unable to tell presenting part, getting hr RUQ, vtx confirmed by u/s Reviewed labor s/s, fkc. Plan:  Continue routine obstetrical care  F/U in 1wk for OB appointment

## 2014-05-31 LAB — STREP B DNA PROBE: GBSP: NOT DETECTED

## 2014-05-31 LAB — GC/CHLAMYDIA PROBE AMP
CT PROBE, AMP APTIMA: NEGATIVE
GC Probe RNA: NEGATIVE

## 2014-06-06 ENCOUNTER — Ambulatory Visit (INDEPENDENT_AMBULATORY_CARE_PROVIDER_SITE_OTHER): Payer: Medicaid Other | Admitting: Women's Health

## 2014-06-06 ENCOUNTER — Encounter: Payer: Self-pay | Admitting: Women's Health

## 2014-06-06 VITALS — BP 110/60 | Wt 188.0 lb

## 2014-06-06 DIAGNOSIS — Z331 Pregnant state, incidental: Secondary | ICD-10-CM

## 2014-06-06 DIAGNOSIS — Z1389 Encounter for screening for other disorder: Secondary | ICD-10-CM

## 2014-06-06 DIAGNOSIS — B3731 Acute candidiasis of vulva and vagina: Secondary | ICD-10-CM

## 2014-06-06 DIAGNOSIS — Z3403 Encounter for supervision of normal first pregnancy, third trimester: Secondary | ICD-10-CM

## 2014-06-06 DIAGNOSIS — B373 Candidiasis of vulva and vagina: Secondary | ICD-10-CM

## 2014-06-06 DIAGNOSIS — N9089 Other specified noninflammatory disorders of vulva and perineum: Secondary | ICD-10-CM

## 2014-06-06 LAB — POCT URINALYSIS DIPSTICK
GLUCOSE UA: NEGATIVE
Ketones, UA: NEGATIVE
Leukocytes, UA: NEGATIVE
Nitrite, UA: NEGATIVE
Protein, UA: NEGATIVE
RBC UA: NEGATIVE

## 2014-06-06 LAB — POCT WET PREP (WET MOUNT): CLUE CELLS WET PREP WHIFF POC: NEGATIVE

## 2014-06-06 MED ORDER — FLUCONAZOLE 150 MG PO TABS: 150.0000 mg | ORAL_TABLET | Freq: Once | ORAL | Status: DC

## 2014-06-06 NOTE — Progress Notes (Signed)
Low-risk OB appointment G1P0 7423w0d Estimated Date of Delivery: 06/20/14 BP 110/60 mmHg  Wt 188 lb (85.276 kg)  LMP 08/16/2013  BP, weight, and urine reviewed.  Refer to obstetrical flow sheet for FH & FHR.  Reports good fm.  Denies regular uc's, lof, vb, or uti s/s. D/c w/ vulvar irritation w/ sex.  Spec exam: cx visually closed, moderate amt thick white nonodorous d/c Wet prep: few yeast, no trich/clues, rx diflucan Reviewed labor s/s, fkc. Plan:  Continue routine obstetrical care  F/U in 1wk for OB appointment

## 2014-06-06 NOTE — Patient Instructions (Signed)
Call the office ([phone]) or go [facility] if:  You begin to have strong, frequent contractions  Your water breaks.  Sometimes it is a big gush of fluid, sometimes it is just a trickle that keeps getting your panties wet or running down your legs  You have vaginal bleeding.  It is normal to have a small amount of spotting if your cervix was checked.   You don't feel your baby moving like normal.  If you don't, get you something to eat and drink and lay down and focus on feeling your baby move.  You should feel at least 10 movements in 2 hours.  If you don't, you should call the office or go to [facility].    Braxton Hicks Contractions Contractions of the uterus can occur throughout pregnancy. Contractions are not always a sign that you are in labor.  WHAT ARE BRAXTON HICKS CONTRACTIONS?  Contractions that occur before labor are called Braxton Hicks contractions, or false labor. Toward the end of pregnancy (32-34 weeks), these contractions can develop more often and may become more forceful. This is not true labor because these contractions do not result in opening (dilatation) and thinning of the cervix. They are sometimes difficult to tell apart from true labor because these contractions can be forceful and people have different pain tolerances. You should not feel embarrassed if you go to the hospital with false labor. Sometimes, the only way to tell if you are in true labor is for your health care provider to look for changes in the cervix. If there are no prenatal problems or other health problems associated with the pregnancy, it is completely safe to be sent home with false labor and await the onset of true labor. HOW CAN YOU TELL THE DIFFERENCE BETWEEN TRUE AND FALSE LABOR? False Labor  The contractions of false labor are usually shorter and not as hard as those of true labor.   The contractions are usually irregular.   The contractions are often felt in the front of  the lower abdomen and in the groin.   The contractions may go away when you walk around or change positions while lying down.   The contractions get weaker and are shorter lasting as time goes on.   The contractions do not usually become progressively stronger, regular, and closer together as with true labor.  True Labor  Contractions in true labor last 30-70 seconds, become very regular, usually become more intense, and increase in frequency.   The contractions do not go away with walking.   The discomfort is usually felt in the top of the uterus and spreads to the lower abdomen and low back.   True labor can be determined by your health care provider with an exam. This will show that the cervix is dilating and getting thinner.  WHAT TO REMEMBER  Keep up with your usual exercises and follow other instructions given by your health care provider.   Take medicines as directed by your health care provider.   Keep your regular prenatal appointments.   Eat and drink lightly if you think you are going into labor.   If Braxton Hicks contractions are making you uncomfortable:   Change your position from lying down or resting to walking, or from walking to resting.   Sit and rest in a tub of warm water.   Drink 2-3 glasses of water. Dehydration may cause these contractions.   Do slow and deep breathing several times an hour.    WHEN SHOULD I SEEK IMMEDIATE MEDICAL CARE? Seek immediate medical care if:  Your contractions become stronger, more regular, and closer together.   You have fluid leaking or gushing from your vagina.   You have a fever.   You pass blood-tinged mucus.   You have vaginal bleeding.   You have continuous abdominal pain.   You have low back pain that you never had before.   You feel your baby's head pushing down and causing pelvic pressure.   Your baby is not moving as much as it used to.  Document Released: 05/11/2005 Document  Revised: 05/16/2013 Document Reviewed: 02/20/2013 ExitCare Patient Information 2015 Exit[employer], LLC. This information is not intended to replace advice given to you by your health care provider. Make sure you discuss any questions you have with your health care provider.  

## 2014-06-13 ENCOUNTER — Encounter: Payer: Medicaid Other | Admitting: Advanced Practice Midwife

## 2014-06-14 ENCOUNTER — Ambulatory Visit (INDEPENDENT_AMBULATORY_CARE_PROVIDER_SITE_OTHER): Payer: Medicaid Other | Admitting: Obstetrics & Gynecology

## 2014-06-14 VITALS — BP 136/72 | Wt 191.0 lb

## 2014-06-14 DIAGNOSIS — Z3403 Encounter for supervision of normal first pregnancy, third trimester: Secondary | ICD-10-CM | POA: Diagnosis not present

## 2014-06-14 DIAGNOSIS — Z1389 Encounter for screening for other disorder: Secondary | ICD-10-CM

## 2014-06-14 DIAGNOSIS — Z331 Pregnant state, incidental: Secondary | ICD-10-CM

## 2014-06-14 LAB — POCT URINALYSIS DIPSTICK
Glucose, UA: NEGATIVE
Ketones, UA: NEGATIVE
LEUKOCYTES UA: NEGATIVE
Nitrite, UA: NEGATIVE
Protein, UA: NEGATIVE
RBC UA: NEGATIVE

## 2014-06-14 NOTE — Progress Notes (Signed)
G1P0 1443w1d Estimated Date of Delivery: 06/20/14  Blood pressure 136/72, weight 191 lb (86.637 kg), last menstrual period 08/16/2013.   BP weight and urine results all reviewed and noted.  Please refer to the obstetrical flow sheet for the fundal height and fetal heart rate documentation:  Patient reports good fetal movement, denies any bleeding and no rupture of membranes symptoms or regular contractions. Patient is without complaints. All questions were answered.  Plan:  Continued routine obstetrical care,   Follow up in 1 weeks for OB appointment, routine

## 2014-06-15 ENCOUNTER — Encounter: Payer: Medicaid Other | Admitting: Obstetrics & Gynecology

## 2014-06-17 ENCOUNTER — Encounter (HOSPITAL_COMMUNITY): Payer: Self-pay | Admitting: Anesthesiology

## 2014-06-17 ENCOUNTER — Encounter (HOSPITAL_COMMUNITY): Payer: Self-pay | Admitting: *Deleted

## 2014-06-17 ENCOUNTER — Inpatient Hospital Stay (HOSPITAL_COMMUNITY)
Admission: AD | Admit: 2014-06-17 | Discharge: 2014-06-19 | DRG: 775 | Disposition: A | Discharge status: Home / Self Care | Payer: Medicaid Other | Source: Ambulatory Visit | Attending: Obstetrics and Gynecology | Admitting: Obstetrics and Gynecology

## 2014-06-17 DIAGNOSIS — Z3A39 39 weeks gestation of pregnancy: Secondary | ICD-10-CM

## 2014-06-17 DIAGNOSIS — A749 Chlamydial infection, unspecified: Secondary | ICD-10-CM

## 2014-06-17 DIAGNOSIS — Z3403 Encounter for supervision of normal first pregnancy, third trimester: Secondary | ICD-10-CM

## 2014-06-17 LAB — TYPE AND SCREEN
ABO/RH(D): O POS
ANTIBODY SCREEN: NEGATIVE

## 2014-06-17 LAB — RAPID URINE DRUG SCREEN, HOSP PERFORMED
Amphetamines: NOT DETECTED
BARBITURATES: NOT DETECTED
Benzodiazepines: NOT DETECTED
Cocaine: NOT DETECTED
Opiates: NOT DETECTED
Tetrahydrocannabinol: POSITIVE — AB

## 2014-06-17 LAB — CBC
HCT: 34.3 % — ABNORMAL LOW (ref 36.0–46.0)
Hemoglobin: 11.4 g/dL — ABNORMAL LOW (ref 12.0–15.0)
MCH: 26.8 pg (ref 26.0–34.0)
MCHC: 33.2 g/dL (ref 30.0–36.0)
MCV: 80.5 fL (ref 78.0–100.0)
Platelets: 214 10*3/uL (ref 150–400)
RBC: 4.26 MIL/uL (ref 3.87–5.11)
RDW: 14.3 % (ref 11.5–15.5)
WBC: 14 10*3/uL — ABNORMAL HIGH (ref 4.0–10.5)

## 2014-06-17 LAB — ABO/RH: ABO/RH(D): O POS

## 2014-06-17 LAB — RPR: RPR Ser Ql: NONREACTIVE

## 2014-06-17 MED ORDER — LACTATED RINGERS IV SOLN
INTRAVENOUS | Status: DC
  Administered 2014-06-17: 12:00:00 via INTRAVENOUS

## 2014-06-17 MED ORDER — OXYCODONE-ACETAMINOPHEN 5-325 MG PO TABS: 2.0000 | ORAL_TABLET | ORAL | Status: DC | PRN

## 2014-06-17 MED ORDER — FENTANYL 2.5 MCG/ML BUPIVACAINE 1/10 % EPIDURAL INFUSION (WH - ANES)
14.0000 mL/h | INTRAMUSCULAR | Status: DC | PRN
  Filled 2014-06-17: qty 125

## 2014-06-17 MED ORDER — DIPHENHYDRAMINE HCL 25 MG PO CAPS: 25.0000 mg | ORAL_CAPSULE | Freq: Four times a day (QID) | ORAL | Status: DC | PRN

## 2014-06-17 MED ORDER — OXYTOCIN 40 UNITS IN LACTATED RINGERS INFUSION - SIMPLE MED
62.5000 mL/h | INTRAVENOUS | Status: DC
  Filled 2014-06-17: qty 1000

## 2014-06-17 MED ORDER — ZOLPIDEM TARTRATE 5 MG PO TABS: 5.0000 mg | ORAL_TABLET | Freq: Every evening | ORAL | Status: DC | PRN

## 2014-06-17 MED ORDER — TETANUS-DIPHTH-ACELL PERTUSSIS 5-2.5-18.5 LF-MCG/0.5 IM SUSP
0.5000 mL | Freq: Once | INTRAMUSCULAR | Status: AC
  Administered 2014-06-18: 0.5 mL via INTRAMUSCULAR
  Filled 2014-06-17: qty 0.5

## 2014-06-17 MED ORDER — ONDANSETRON HCL 4 MG PO TABS: 4.0000 mg | ORAL_TABLET | ORAL | Status: DC | PRN

## 2014-06-17 MED ORDER — SENNOSIDES-DOCUSATE SODIUM 8.6-50 MG PO TABS
2.0000 | ORAL_TABLET | ORAL | Status: DC
  Administered 2014-06-18 – 2014-06-19 (×2): 2 via ORAL
  Filled 2014-06-17 (×2): qty 2

## 2014-06-17 MED ORDER — ONDANSETRON HCL 4 MG/2ML IJ SOLN: 4.0000 mg | Freq: Four times a day (QID) | INTRAMUSCULAR | Status: DC | PRN

## 2014-06-17 MED ORDER — OXYCODONE-ACETAMINOPHEN 5-325 MG PO TABS: 1.0000 | ORAL_TABLET | ORAL | Status: DC | PRN

## 2014-06-17 MED ORDER — OXYTOCIN 40 UNITS IN LACTATED RINGERS INFUSION - SIMPLE MED: 62.5000 mL/h | INTRAVENOUS | Status: DC | PRN

## 2014-06-17 MED ORDER — EPHEDRINE 5 MG/ML INJ
10.0000 mg | INTRAVENOUS | Status: DC | PRN
  Filled 2014-06-17: qty 2

## 2014-06-17 MED ORDER — ACETAMINOPHEN 325 MG PO TABS: 650.0000 mg | ORAL_TABLET | ORAL | Status: DC | PRN

## 2014-06-17 MED ORDER — IBUPROFEN 600 MG PO TABS
600.0000 mg | ORAL_TABLET | Freq: Four times a day (QID) | ORAL | Status: DC
  Administered 2014-06-17 – 2014-06-19 (×8): 600 mg via ORAL
  Filled 2014-06-17 (×8): qty 1

## 2014-06-17 MED ORDER — DIPHENHYDRAMINE HCL 50 MG/ML IJ SOLN: 12.5000 mg | INTRAMUSCULAR | Status: DC | PRN

## 2014-06-17 MED ORDER — FENTANYL CITRATE 0.05 MG/ML IJ SOLN
100.0000 ug | INTRAMUSCULAR | Status: DC | PRN
Start: 2014-06-17 — End: 2014-06-17
  Administered 2014-06-17 (×2): 100 ug via INTRAVENOUS
  Filled 2014-06-17 (×2): qty 2

## 2014-06-17 MED ORDER — LACTATED RINGERS IV SOLN: 500.0000 mL | INTRAVENOUS | Status: DC | PRN

## 2014-06-17 MED ORDER — DIBUCAINE 1 % RE OINT
1.0000 | TOPICAL_OINTMENT | RECTAL | Status: DC | PRN
Start: 2014-06-17 — End: 2014-06-19
  Administered 2014-06-18: 1 via RECTAL
  Filled 2014-06-17: qty 28

## 2014-06-17 MED ORDER — LIDOCAINE HCL (PF) 1 % IJ SOLN
30.0000 mL | INTRAMUSCULAR | Status: DC | PRN
  Filled 2014-06-17: qty 30

## 2014-06-17 MED ORDER — LANOLIN HYDROUS EX OINT: TOPICAL_OINTMENT | CUTANEOUS | Status: DC | PRN

## 2014-06-17 MED ORDER — SIMETHICONE 80 MG PO CHEW: 80.0000 mg | CHEWABLE_TABLET | ORAL | Status: DC | PRN

## 2014-06-17 MED ORDER — PHENYLEPHRINE 40 MCG/ML (10ML) SYRINGE FOR IV PUSH (FOR BLOOD PRESSURE SUPPORT)
80.0000 ug | PREFILLED_SYRINGE | INTRAVENOUS | Status: DC | PRN
Start: 2014-06-17 — End: 2014-06-17
  Filled 2014-06-17: qty 2

## 2014-06-17 MED ORDER — FLEET ENEMA 7-19 GM/118ML RE ENEM: 1.0000 | ENEMA | Freq: Once | RECTAL | Status: DC

## 2014-06-17 MED ORDER — LACTATED RINGERS IV SOLN: 500.0000 mL | Freq: Once | INTRAVENOUS | Status: DC

## 2014-06-17 MED ORDER — PHENYLEPHRINE 40 MCG/ML (10ML) SYRINGE FOR IV PUSH (FOR BLOOD PRESSURE SUPPORT)
80.0000 ug | PREFILLED_SYRINGE | INTRAVENOUS | Status: DC | PRN
  Filled 2014-06-17: qty 2
  Filled 2014-06-17: qty 20

## 2014-06-17 MED ORDER — OXYTOCIN 40 UNITS IN LACTATED RINGERS INFUSION - SIMPLE MED
1.0000 m[IU]/min | INTRAVENOUS | Status: DC
  Administered 2014-06-17: 2 m[IU]/min via INTRAVENOUS

## 2014-06-17 MED ORDER — BENZOCAINE-MENTHOL 20-0.5 % EX AERO
1.0000 | INHALATION_SPRAY | CUTANEOUS | Status: DC | PRN
Start: 2014-06-17 — End: 2014-06-19
  Filled 2014-06-17: qty 56

## 2014-06-17 MED ORDER — TERBUTALINE SULFATE 1 MG/ML IJ SOLN
0.2500 mg | Freq: Once | INTRAMUSCULAR | Status: DC | PRN
  Filled 2014-06-17: qty 1

## 2014-06-17 MED ORDER — CITRIC ACID-SODIUM CITRATE 334-500 MG/5ML PO SOLN: 30.0000 mL | ORAL | Status: DC | PRN

## 2014-06-17 MED ORDER — PNEUMOCOCCAL VAC POLYVALENT 25 MCG/0.5ML IJ INJ
0.5000 mL | INJECTION | INTRAMUSCULAR | Status: AC
  Administered 2014-06-18: 0.5 mL via INTRAMUSCULAR
  Filled 2014-06-17: qty 0.5

## 2014-06-17 MED ORDER — WITCH HAZEL-GLYCERIN EX PADS
1.0000 "application " | MEDICATED_PAD | CUTANEOUS | Status: DC | PRN
  Administered 2014-06-18: 1 via TOPICAL

## 2014-06-17 MED ORDER — OXYTOCIN BOLUS FROM INFUSION: 500.0000 mL | INTRAVENOUS | Status: DC

## 2014-06-17 MED ORDER — ONDANSETRON HCL 4 MG/2ML IJ SOLN: 4.0000 mg | INTRAMUSCULAR | Status: DC | PRN

## 2014-06-17 NOTE — MAU Note (Signed)
Pt presents to MAU with complaints of large gush of fluid around 6 this morning. Reports contractions started shortly after

## 2014-06-17 NOTE — Anesthesia Preprocedure Evaluation (Deleted)
Anesthesia Evaluation  Patient identified by MRN, date of birth, ID band Patient awake    Reviewed: Allergy & Precautions, H&P , Patient's Chart, lab work & pertinent test results  Airway Mallampati: II  TM Distance: >3 FB Neck ROM: full    Dental   Pulmonary former smoker,  breath sounds clear to auscultation        Cardiovascular Rhythm:regular Rate:Normal     Neuro/Psych    GI/Hepatic   Endo/Other    Renal/GU      Musculoskeletal   Abdominal   Peds  Hematology   Anesthesia Other Findings   Reproductive/Obstetrics (+) Pregnancy                            Anesthesia Physical Anesthesia Plan  ASA: II  Anesthesia Plan: Epidural   Post-op Pain Management:    Induction:   Airway Management Planned:   Additional Equipment:   Intra-op Plan:   Post-operative Plan:   Informed Consent: I have reviewed the patients History and Physical, chart, labs and discussed the procedure including the risks, benefits and alternatives for the proposed anesthesia with the patient or authorized representative who has indicated his/her understanding and acceptance.     Plan Discussed with:   Anesthesia Plan Comments:         Anesthesia Quick Evaluation Patient declines epidural

## 2014-06-17 NOTE — Progress Notes (Signed)
Dr Arnette FeltsAcousta at bedside orders received

## 2014-06-17 NOTE — H&P (Signed)
Brooke Hunt is a 24 y.o. female presenting for SROM. Maternal Medical History:  Reason for admission: Rupture of membranes and contractions.   Contractions: Onset was 1-2 hours ago.   Frequency: regular.   Perceived severity is moderate.    Fetal activity: Perceived fetal activity is normal.   Last perceived fetal movement was within the past hour.    Prenatal complications: Infection and substance abuse.   No pre-eclampsia or preterm labor.   +THC, UDS sent Tx'd for chlamydia, tested neg 1/6  Prenatal Complications - Diabetes: none.    OB History    Gravida Para Term Preterm AB TAB SAB Ectopic Multiple Living   1              Past Medical History  Diagnosis Date  . Medical history non-contributory    Past Surgical History  Procedure Laterality Date  . No past surgeries     Family History: family history includes Arthritis in her maternal grandmother; Dementia in her maternal grandmother. Social History:  reports that she has quit smoking. Her smoking use included Cigarettes. She has a .25 pack-year smoking history. She has never used smokeless tobacco. She reports that she does not drink alcohol or use illicit drugs.   Prenatal Transfer Tool  Maternal Diabetes: No Genetic Screening: Normal Maternal Ultrasounds/Referrals: Normal Fetal Ultrasounds or other Referrals:  None Maternal Substance Abuse:  Yes:  Type: Marijuana Significant Maternal Medications:  None Significant Maternal Lab Results:  None Other Comments:  both pt & partner tx'd for chlamydia, tested neg 1/6  Review of Systems  Constitutional: Negative for fever and chills.  HENT: Negative.   Eyes: Negative.   Respiratory: Negative.   Cardiovascular: Negative.   Gastrointestinal: Negative.   Genitourinary: Negative.   Musculoskeletal: Negative.   Skin: Negative.   Neurological: Negative.   Endo/Heme/Allergies: Negative.   Psychiatric/Behavioral: Negative.     Dilation: 5 Effacement (%):  80 Station: -2 Exam by:: Smith Blood pressure 106/79, pulse 81, temperature 98.5 F (36.9 C), temperature source Oral, resp. rate 20, height 5\' 3"  (1.6 m), weight 85.276 kg (188 lb), last menstrual period 08/16/2013. Maternal Exam:  Uterine Assessment: Contraction strength is moderate.  Contraction frequency is regular.   Abdomen: Patient reports no abdominal tenderness. Fetal presentation: vertex  Introitus: Amniotic fluid character: meconium stained.  Cervix: Cervix evaluated by digital exam.   5 cm by RN  Physical Exam  Constitutional: She is oriented to person, place, and time. She appears well-developed and well-nourished.  HENT:  Head: Normocephalic.  Eyes: Conjunctivae are normal.  Neck: Normal range of motion.  Cardiovascular: Normal rate and regular rhythm.   Respiratory: Effort normal. No respiratory distress.  GI: Soft. There is no tenderness.  Musculoskeletal: Normal range of motion. She exhibits no edema.  Neurological: She is alert and oriented to person, place, and time.  Skin: Skin is warm and dry.  Psychiatric: She has a normal mood and affect. Her behavior is normal. Judgment and thought content normal.    Prenatal labs: ABO, Rh: O/POS/-- (08/19 1430) Antibody: NEG (11/02 1000) Rubella: 1.12 (08/19 1430) RPR: NON REAC (11/02 1000)  HBsAg: NEGATIVE (08/19 1430)  HIV: NONREACTIVE (11/02 1000)  GBS: NOT DETECTED (01/06 1423)   Assessment/Plan: A: SIUP @[redacted]w[redacted]d  Active labor SROM, meconium-stained fluid   P: Admit Epidural as desired Anticipate SVD  Blanca Thornton Wynne 06/17/2014, 9:21 AM

## 2014-06-17 NOTE — Progress Notes (Signed)
Brooke Hunt is a 24 y.o. G1P0 at 7557w4d admitted for SROM & contractions Subjective: Unable to tolerate epidural procedure.  Feeling some relief from IV Fentanyl.  Objective: BP 105/78 mmHg  Pulse 82  Temp(Src) 98.5 F (36.9 C) (Oral)  Resp 18  Ht 5\' 3"  (1.6 m)  Wt 85.276 kg (188 lb)  BMI 33.31 kg/m2  LMP 08/16/2013    FHT:  FHR: 135 bpm, variability: moderate,  accelerations:  Present,  decelerations:  Present early UC:   regular  SVE:   Dilation: 5.5 Effacement (%): 80 Station: 0 Exam by:: [employer]isa    Labs: Lab Results  Component Value Date   WBC 14.0* 06/17/2014   HGB 11.4* 06/17/2014   HCT 34.3* 06/17/2014   MCV 80.5 06/17/2014   PLT 214 06/17/2014    Assessment / Plan: Spontaneous labor, progressing normally  Labor: Progressing normally Fetal Wellbeing:  Category I Pain Control:  Fentanyl Pre-eclampsia: no s/s, BP WNL I/D:  GBS negative Anticipated MOD:  NSVD  Nakyah Erdmann Wynne SNM 06/17/2014, 11:10 AM

## 2014-06-17 NOTE — Progress Notes (Signed)
Patient ID: Brooke Hunt, female   DOB: 10/20/1990, 24 y.o.   MRN: [id number] Brooke Hunt is a 24 y.o. G1P0 at 8019w4d admitted for SROM & contractions  Subjective: Coping with contractions, and refuses pain meds at this time.  Objective: BP 110/68 mmHg  Pulse 88  Temp(Src) 98.5 F (36.9 C) (Oral)  Resp 20  Ht 5\' 3"  (1.6 m)  Wt 85.276 kg (188 lb)  BMI 33.31 kg/m2  LMP 08/16/2013    FHT:  FHR: 135 bpm, variability: moderate,  accelerations:  Present,  decelerations:  Absent UC:   irregular, every 3-5 minutes  SVE:   Dilation: 5.5 Effacement (%): 80 Station: 0 Exam by:: [employer]Lisa   Labs: Lab Results  Component Value Date   WBC 14.0* 06/17/2014   HGB 11.4* 06/17/2014   HCT 34.3* 06/17/2014   MCV 80.5 06/17/2014   PLT 214 06/17/2014    Assessment / Plan: A: Protracted latent phase Labor: no progress in >4 hrs Fetal Wellbeing:  Category I Pain Control:  Fentanyl prn I/D:  GBS negative  P: Begin Pitocin augmentation Re-check in 2-3 hours Anticipate NSVD  Soumya Colson Wynne SNM 06/17/2014, 1:03 PM

## 2014-06-18 ENCOUNTER — Encounter (HOSPITAL_COMMUNITY): Payer: Self-pay | Admitting: *Deleted

## 2014-06-18 NOTE — Progress Notes (Signed)
Clinical Social Work Department PSYCHOSOCIAL ASSESSMENT - MATERNAL/CHILD 09/15/2014  Patient:  Brooke Hunt  Account Number:  [id number]  Admit Date:  03/21/15  Ardine Eng Name:   Brooke Hunt   Clinical Social Worker:  Lucita Ferrara, CLINICAL SOCIAL WORKER   Date/Time:  11/01/14 03:00 PM  Date Referred:  03/13/2015   Referral source  Central Nursery     Referred reason  Substance Abuse   Other referral source:    I:  FAMILY / HOME ENVIRONMENT Child's legal guardian:  PARENT  Guardian - Name Guardian - Age Brooke Hunt 23 [address] [place], [place] [address]  [name]  same residence   Other household support members/support persons Name Relationship DOB   MOTHER    Other support:   MOB endorsed strong family support, and identified her mother and sisters as her primary supports.    II  PSYCHOSOCIAL DATA Information Source:  Patient Interview  Occupational hygienist Employment:   MOB stated that she is currently unemployed.   Financial resources:  Medicaid If Medicaid - County:  [place] / Grade:  N/A Music therapist / Child Services Coordination / Early Interventions:   None reported  Cultural issues impacting care:   None reported    III  STRENGTHS Strengths  Adequate Resources  Home prepared for Child (including basic supplies)  Supportive family/friends   Strength comment:    IV  RISK FACTORS AND CURRENT PROBLEMS Current Problem:  YES   Risk Factor & Current Problem Patient Issue Family Issue Risk Factor / Current Problem Comment  Substance Abuse Y N MOB reported THC use throughout pregnancy, last use day of baby's birth.  MOB had UDS positive for [facility] 8/19, 9/10, 11/23, and 1/24.  Baby's UDS and MDS are pending.         V  SOCIAL WORK ASSESSMENT CSW met with the MOB due to [facility] use during the pregnancy.  MOB presented as receptive and easily engaged.  She  displayed a full range in affect, presented in a pleasant mood, and was observed to be smiling while interacting with the baby.  MOB readily answered CSW questions related to her substance use, and was polite/respectful as CSW discussed hospital drug screen policy.   MOB expressed excitement as she transitions to the postpartum period.  She smiled as she reflected upon her new role as a mother, and stated that she is looking forward to being a mother.  The MOB frequently discussed how much she loves the baby and how much she is looking forward to "being the best".  She endorsed strong family support and shared that the home is well prepared.  MOB discussed numerous experiences with young children since she has nieces and nephews, and that she is looking forward to now having "my own".  The MOB denied acute stressors that may impact her transition into the postpartum period.  She stated that she is unemployed, but since she lives with the [facility], she feels well supported and confident that all basic needs will be met for herself and the baby.  She denied mental health history, and presented as engaged as CSW provided education on postpartum depression.  MOB agreeable to contacting her MD if she notes symptoms, but she stated, "I can't imaging not being happy" and shared that she would have no barriers to talking to her MD since she knows that something would be wrong  if she felt depressed.   MOB acknowledged THC use throughout the pregnancy.  Per MOB, "I've used since I've been young", and she did not identify an exact reason for use while she was pregnant.  She stated that she stopped smoking THC in November, but shared that she did smoke prior to delivery in order to assist with nausea.  She stated that the FOB became upset since she smoked THC so close to the baby's birth.  MOB verbalized understanding of hospital drug screen policy and acknowledged that CPS will be contact if the UDS and/or MDS is positive.  MOB  denied concerns about potential CPS involvement, and shared that she had been anticipating this conversation with the CSW given her THC use and her positive drug screens during the pregnancy.    MOB denied additional questions or concerns.  She expressed appreciation for the visit an acknowledged ongoing CSW availability as needed. MOB agreed to contact CSW as needs arise.   VI SOCIAL WORK PLAN Social Work Therapist, art  No Further Intervention Required / No Barriers to Discharge   Type of pt/family education:   Postpartum depression  Hospital drug screen policy   If child protective services report - county:   If child protective services report - date:   Information/referral to community resources comment:      Other social work plan:   CSW to follow up as needed or upon MOB request.  CSW will monitor UDS and MDS, and will make CPS report if positive for substances.

## 2014-06-18 NOTE — Progress Notes (Signed)
Post Partum Day 1 Subjective: no complaints, up ad lib, voiding and tolerating PO  Objective: Blood pressure 113/64, pulse 89, temperature 98.6 F (37 C), temperature source Axillary, resp. rate 17, height 5\' 3"  (1.6 m), weight 188 lb (85.276 kg), last menstrual period 08/16/2013, SpO2 99 %, unknown if currently breastfeeding.  Physical Exam:  General: alert, cooperative, appears stated age and no distress Lochia: appropriate Uterine Fundus: firm Incision: n/a DVT Evaluation: No evidence of DVT seen on physical exam. Negative Homan's sign. No cords or calf tenderness. No significant calf/ankle edema.   Recent Labs  06/17/14 0830  HGB 11.4*  HCT 34.3*    Assessment/Plan: Plan for discharge tomorrow   LOS: 1 day   Nairi Oswald DARLENE 06/18/2014, 5:54 AM

## 2014-06-18 NOTE — Progress Notes (Signed)
UR chart review completed.  

## 2014-06-19 NOTE — Discharge Summary (Signed)
Patient is 24 y.o. G1P1001 3380w4d presented for SROM and contractions.  Prenatal complications: Infection and substance abuse.  No pre-eclampsia or preterm labor.  Tx'd for chlamydia, tested neg 1/6. SVD of baby boy, bottle feeding. Desires OCPs for contraception.   Obstetric Discharge Summary Reason for Admission: rupture of membranes Prenatal Procedures: none Intrapartum Procedures: spontaneous vaginal delivery Postpartum Procedures: none Complications-Operative and Postpartum: vaginal laceration HEMOGLOBIN  Date Value Ref Range Status  06/17/2014 11.4* 12.0 - 15.0 g/dL Final   HCT  Date Value Ref Range Status  06/17/2014 34.3* 36.0 - 46.0 % Final    Physical Exam:  General: alert, cooperative and mild distress Lochia: appropriate Uterine Fundus: firm Incision: na DVT Evaluation: No evidence of DVT seen on physical exam.  Discharge Diagnoses: Term Pregnancy-delivered  Discharge Information: Date: 06/19/2014 Activity: unrestricted Diet: routine Medications: None Condition: stable Instructions: see attachment Discharge to: home Follow-up Information    Follow up with [facility]. Schedule an appointment as soon as possible for a visit in 6 weeks.   Contact information:   [address] [place] [phone]      Newborn Data: Live born female  Birth Weight: 6 lb 12.5 oz (3075 g) APGAR: 8, 9  Home with mother.  Brooke Hunt, Brooke Hunt 06/19/2014, 7:18 AM

## 2014-06-21 ENCOUNTER — Encounter: Payer: Medicaid Other | Admitting: Advanced Practice Midwife

## 2014-07-23 ENCOUNTER — Ambulatory Visit: Payer: Medicaid Other

## 2014-07-26 ENCOUNTER — Ambulatory Visit: Payer: Medicaid Other | Admitting: Adult Health

## 2014-07-26 ENCOUNTER — Encounter: Payer: Self-pay | Admitting: *Deleted

## 2014-07-27 ENCOUNTER — Encounter: Payer: Self-pay | Admitting: Adult Health

## 2014-07-27 ENCOUNTER — Ambulatory Visit (INDEPENDENT_AMBULATORY_CARE_PROVIDER_SITE_OTHER): Payer: Medicaid Other | Admitting: Adult Health

## 2014-07-27 DIAGNOSIS — Z309 Encounter for contraceptive management, unspecified: Secondary | ICD-10-CM

## 2014-07-27 DIAGNOSIS — Z3202 Encounter for pregnancy test, result negative: Secondary | ICD-10-CM | POA: Diagnosis not present

## 2014-07-27 DIAGNOSIS — Z1331 Encounter for screening for depression: Secondary | ICD-10-CM

## 2014-07-27 DIAGNOSIS — Z30011 Encounter for initial prescription of contraceptive pills: Secondary | ICD-10-CM

## 2014-07-27 HISTORY — DX: Encounter for contraceptive management, unspecified: Z30.9

## 2014-07-27 LAB — POCT URINE PREGNANCY: PREG TEST UR: NEGATIVE

## 2014-07-27 MED ORDER — NORETHIN ACE-ETH ESTRAD-FE 1-20 MG-MCG(24) PO CHEW: 1.0000 | CHEWABLE_TABLET | Freq: Every day | ORAL | Status: DC

## 2014-07-27 NOTE — Progress Notes (Signed)
Patient ID: Brooke Hunt, female   DOB: 04/05/1991, 24 y.o.   MRN: [id number] Brooke Hunt is a 24 year old black female in for postpartum visit.  Delivery Date:06/17/14 vaginal delivery baby boy Kendrell 6 lbs 12 ozs. Method of Delivery: Vaginal   Sexual Activity since delivery: No  Method of Feeding: bottle  Number of weeks bleeding post delivery: 3-4 weeks  Review of Systems: Patient denies any headaches, hearing loss, fatigue, blurred vision, shortness of breath, chest pain, abdominal pain, problems with bowel movements, urination, or intercourse(No sex yet). No joint pain or mood swings. Reviewed past medical,surgical, social and family history. Reviewed medications and allergies.   Depression Score: 0  BP 138/70 mmHg  Pulse 77  Ht 5\' 2"  (1.575 m)  Wt 168 lb 8 oz (76.431 kg)  BMI 30.81 kg/m2  LMP 07/22/2014 (Approximate)  Breastfeeding? No UPT negative Pelvic Exam:   External genitalia is normal in appearance, no lesions.  The vagina has good color, moisture and rugae, no lesions, + period like blood.Urethra has no masses or tenderness noted. The cervix is bulbous.  Uterus is felt to be normal size, shape, and contour, well involuted.  No adnexal masses or tenderness noted.Bladder is non tender, no masses felt.no hemorrhoids seen. Wants OCs for birth control, will start Minastrin today,explained how to take.  Impression:  Status post delivery, post partum check, depression screening, contraceptive management.   Plan:   Rx minastrin take 1 daily with 11 refills, 1 pack given for today lot [id number] A exp 9/16 Return in 1 year for pap and physical  Use condoms

## 2014-07-27 NOTE — Patient Instructions (Signed)
Start minastrin today use condoms Pap and physical in 1 year Oral Contraception Use Oral contraceptive pills (OCPs) are medicines taken to prevent pregnancy. OCPs work by preventing the ovaries from releasing eggs. The hormones in OCPs also cause the cervical mucus to thicken, preventing the sperm from entering the uterus. The hormones also cause the uterine lining to become thin, not allowing a fertilized egg to attach to the inside of the uterus. OCPs are highly effective when taken exactly as prescribed. However, OCPs do not prevent sexually transmitted diseases (STDs). Safe sex practices, such as using condoms along with an OCP, can help prevent STDs. Before taking OCPs, you may have a physical exam and Pap test. Your health care provider may also order blood tests if necessary. Your health care provider will make sure you are a good candidate for oral contraception. Discuss with your health care provider the possible side effects of the OCP you may be prescribed. When starting an OCP, it can take 2 to 3 months for the body to adjust to the changes in hormone levels in your body.  HOW TO TAKE ORAL CONTRACEPTIVE PILLS Your health care provider may advise you on how to start taking the first cycle of OCPs. Otherwise, you can:   Start on day 1 of your menstrual period. You will not need any backup contraceptive protection with this start time.   Start on the first Sunday after your menstrual period or the day you get your prescription. In these cases, you will need to use backup contraceptive protection for the first week.   Start the pill at any time of your cycle. If you take the pill within 5 days of the start of your period, you are protected against pregnancy right away. In this case, you will not need a backup form of birth control. If you start at any other time of your menstrual cycle, you will need to use another form of birth control for 7 days. If your OCP is the type called a minipill, it  will protect you from pregnancy after taking it for 2 days (48 hours). After you have started taking OCPs:   If you forget to take 1 pill, take it as soon as you remember. Take the next pill at the regular time.   If you miss 2 or more pills, call your health care provider because different pills have different instructions for missed doses. Use backup birth control until your next menstrual period starts.   If you use a 28-day pack that contains inactive pills and you miss 1 of the last 7 pills (pills with no hormones), it will not matter. Throw away the rest of the non-hormone pills and start a new pill pack.  No matter which day you start the OCP, you will always start a new pack on that same day of the week. Have an extra pack of OCPs and a backup contraceptive method available in case you miss some pills or lose your OCP pack.  HOME CARE INSTRUCTIONS   Do not smoke.   Always use a condom to protect against STDs. OCPs do not protect against STDs.   Use a calendar to mark your menstrual period days.   Read the information and directions that came with your OCP. Talk to your health care provider if you have questions.  SEEK MEDICAL CARE IF:   You develop nausea and vomiting.   You have abnormal vaginal discharge or bleeding.   You develop a rash.  You miss your menstrual period.   You are losing your hair.   You need treatment for mood swings or depression.   You get dizzy when taking the OCP.   You develop acne from taking the OCP.   You become pregnant.  SEEK IMMEDIATE MEDICAL CARE IF:   You develop chest pain.   You develop shortness of breath.   You have an uncontrolled or severe headache.   You develop numbness or slurred speech.   You develop visual problems.   You develop pain, redness, and swelling in the legs.  Document Released: 04/30/2011 Document Revised: 09/25/2013 Document Reviewed: 10/30/2012 [facility] Patient Information  2015 [place]. This information is not intended to replace advice given to you by your health care provider. Make sure you discuss any questions you have with your health care provider.

## 2014-10-11 NOTE — H&P (Signed)
Brooke Hunt, Student-MidW Student-Midwife Attested Obstetrics H&P 06/17/2014 8:45 AM    Attestation signed by Ferdie PingMarie Darlene Lawson, CNM at 06/17/2014 10:41 AM  Agree with above assessment    Expand All Collapse All   Brooke Hunt is a 24 y.o. female presenting for SROM. Maternal Medical History:  Reason for admission: Rupture of membranes and contractions.   Contractions: Onset was 1-2 hours ago.  Frequency: regular.  Perceived severity is moderate.   Fetal activity: Perceived fetal activity is normal.  Last perceived fetal movement was within the past hour.   Prenatal complications: Infection and substance abuse.  No pre-eclampsia or preterm labor.  +THC, UDS sent Tx'd for chlamydia, tested neg 1/6  Prenatal Complications - Diabetes: none.   OB History    Gravida Para Term Preterm AB TAB SAB Ectopic Multiple Living   1              Past Medical History  Diagnosis Date  . Medical history non-contributory    Past Surgical History  Procedure Laterality Date  . No past surgeries     Family History: family history includes Arthritis in her maternal grandmother; Dementia in her maternal grandmother. Social History:  reports that she has quit smoking. Her smoking use included Cigarettes. She has a .25 pack-year smoking history. She has never used smokeless tobacco. She reports that she does not drink alcohol or use illicit drugs.   Prenatal Transfer Tool  Maternal Diabetes: No Genetic Screening: Normal Maternal Ultrasounds/Referrals: Normal Fetal Ultrasounds or other Referrals: None Maternal Substance Abuse: Yes: Type: Marijuana Significant Maternal Medications: None Significant Maternal Lab Results: None Other Comments: both pt & partner tx'd for chlamydia, tested neg 1/6  Review of Systems  Constitutional: Negative for fever and chills.  HENT: Negative.  Eyes: Negative.  Respiratory: Negative.   Cardiovascular: Negative.  Gastrointestinal: Negative.  Genitourinary: Negative.  Musculoskeletal: Negative.  Skin: Negative.  Neurological: Negative.  Endo/Heme/Allergies: Negative.  Psychiatric/Behavioral: Negative.    Dilation: 5 Effacement (%): 80 Station: -2 Exam by:: Smith Blood pressure 106/79, pulse 81, temperature 98.5 F (36.9 C), temperature source Oral, resp. rate 20, height 5\' 3"  (1.6 m), weight 85.276 kg (188 lb), last menstrual period 08/16/2013. Maternal Exam:  Uterine Assessment: Contraction strength is moderate. Contraction frequency is regular.   Abdomen: Patient reports no abdominal tenderness. Fetal presentation: vertex  Introitus: Amniotic fluid character: meconium stained.  Cervix: Cervix evaluated by digital exam.  5 cm by RN  Physical Exam  Constitutional: She is oriented to person, place, and time. She appears well-developed and well-nourished.  HENT:  Head: Normocephalic.  Eyes: Conjunctivae are normal.  Neck: Normal range of motion.  Cardiovascular: Normal rate and regular rhythm.  Respiratory: Effort normal. No respiratory distress.  GI: Soft. There is no tenderness.  Musculoskeletal: Normal range of motion. She exhibits no edema.  Neurological: She is alert and oriented to person, place, and time.  Skin: Skin is warm and dry.  Psychiatric: She has a normal mood and affect. Her behavior is normal. Judgment and thought content normal.    Prenatal labs: ABO, Rh: O/POS/-- (08/19 1430) Antibody: NEG (11/02 1000) Rubella: 1.12 (08/19 1430) RPR: NON REAC (11/02 1000)  HBsAg: NEGATIVE (08/19 1430)  HIV: NONREACTIVE (11/02 1000)  GBS: NOT DETECTED (01/06 1423)   Assessment/Plan: A: SIUP @[redacted]w[redacted]d  Active labor SROM, meconium-stained fluid  P: Admit Epidural as desired Anticipate SVD  Brooke Hunt,Brooke Hunt 06/17/2014, 9:21 AM          Cosigned by:  Ferdie PingMarie Darlene Lawson, CNM at 06/17/2014 10:41 AM

## 2015-04-03 ENCOUNTER — Ambulatory Visit (INDEPENDENT_AMBULATORY_CARE_PROVIDER_SITE_OTHER): Payer: Medicaid Other | Admitting: Adult Health

## 2015-04-03 ENCOUNTER — Encounter: Payer: Self-pay | Admitting: Adult Health

## 2015-04-03 VITALS — BP 128/72 | HR 106 | Ht 63.0 in | Wt 174.5 lb

## 2015-04-03 DIAGNOSIS — Z363 Encounter for antenatal screening for malformations: Secondary | ICD-10-CM

## 2015-04-03 DIAGNOSIS — Z3490 Encounter for supervision of normal pregnancy, unspecified, unspecified trimester: Secondary | ICD-10-CM

## 2015-04-03 DIAGNOSIS — Z3201 Encounter for pregnancy test, result positive: Secondary | ICD-10-CM | POA: Diagnosis not present

## 2015-04-03 DIAGNOSIS — N926 Irregular menstruation, unspecified: Secondary | ICD-10-CM | POA: Diagnosis not present

## 2015-04-03 HISTORY — DX: Encounter for supervision of normal pregnancy, unspecified, unspecified trimester: Z34.90

## 2015-04-03 LAB — POCT URINE PREGNANCY: PREG TEST UR: POSITIVE — AB

## 2015-04-03 MED ORDER — PRENATAL PLUS 27-1 MG PO TABS: 1.0000 | ORAL_TABLET | Freq: Every day | ORAL | Status: DC

## 2015-04-03 NOTE — Progress Notes (Signed)
Subjective:     Patient ID: Brooke Hunt, female   DOB: 10/30/1990, 24 y.o.   MRN: [id number]  HPI Brooke Hunt is a 24 year old black female in for UPT, has felt fetal movement.G2P1, last sex about 6 months ago,but LMP in September.   Review of Systems Patient denies any headaches, hearing loss, fatigue, blurred vision, shortness of breath, chest pain, abdominal pain, problems with bowel movements, urination, or intercourse. No joint pain or mood swings. Reviewed past medical,surgical, social and family history. Reviewed medications and allergies.     Objective:   Physical Exam BP 128/72 mmHg  Pulse 106  Ht 5\' 3"  (1.6 m)  Wt 174 lb 8 oz (79.153 kg)  BMI 30.92 kg/m2  LMP 01/31/2015  Breastfeeding? No  UPT + about 8+6 weeks by LMP but feels movement,FHR 160 by doppler and FH 25 cm,   Skin warm and dry. Neck: mid line trachea, normal thyroid, good ROM, no lymphadenopathy noted. Lungs: clear to ausculation bilaterally. Cardiovascular: regular rate and rhythm.will get in for US and Rx prenatal plus.    Assessment:     Pregnant     Plan:     Return in 1 week for anatomy and dating US Rx prenatal plus #30 take 1 daily with 12 refills Review handout on second trimester

## 2015-04-03 NOTE — Patient Instructions (Signed)

## 2015-04-11 ENCOUNTER — Ambulatory Visit (INDEPENDENT_AMBULATORY_CARE_PROVIDER_SITE_OTHER): Payer: Medicaid Other

## 2015-04-11 DIAGNOSIS — Z363 Encounter for antenatal screening for malformations: Secondary | ICD-10-CM

## 2015-04-11 DIAGNOSIS — Z36 Encounter for antenatal screening of mother: Secondary | ICD-10-CM | POA: Diagnosis not present

## 2015-04-11 NOTE — Progress Notes (Signed)
US 30+6wks,single IUP,EFW 1813g,normal ov's bilat, afi 16cm,FHR 134bpm,cx 3.6cm,ant pl gr 1,cephalic

## 2015-04-17 ENCOUNTER — Ambulatory Visit (INDEPENDENT_AMBULATORY_CARE_PROVIDER_SITE_OTHER): Payer: Medicaid Other | Admitting: Women's Health

## 2015-04-17 ENCOUNTER — Encounter: Payer: Self-pay | Admitting: Women's Health

## 2015-04-17 VITALS — BP 140/70 | HR 92 | Wt 182.0 lb

## 2015-04-17 DIAGNOSIS — O09899 Supervision of other high risk pregnancies, unspecified trimester: Secondary | ICD-10-CM | POA: Insufficient documentation

## 2015-04-17 DIAGNOSIS — Z8619 Personal history of other infectious and parasitic diseases: Secondary | ICD-10-CM

## 2015-04-17 DIAGNOSIS — O99333 Smoking (tobacco) complicating pregnancy, third trimester: Secondary | ICD-10-CM

## 2015-04-17 DIAGNOSIS — O09893 Supervision of other high risk pregnancies, third trimester: Secondary | ICD-10-CM | POA: Diagnosis not present

## 2015-04-17 DIAGNOSIS — Z3403 Encounter for supervision of normal first pregnancy, third trimester: Secondary | ICD-10-CM

## 2015-04-17 DIAGNOSIS — Z3A32 32 weeks gestation of pregnancy: Secondary | ICD-10-CM | POA: Diagnosis not present

## 2015-04-17 DIAGNOSIS — Z331 Pregnant state, incidental: Secondary | ICD-10-CM

## 2015-04-17 DIAGNOSIS — F17200 Nicotine dependence, unspecified, uncomplicated: Secondary | ICD-10-CM | POA: Insufficient documentation

## 2015-04-17 DIAGNOSIS — Z0283 Encounter for blood-alcohol and blood-drug test: Secondary | ICD-10-CM

## 2015-04-17 DIAGNOSIS — Z1389 Encounter for screening for other disorder: Secondary | ICD-10-CM | POA: Diagnosis not present

## 2015-04-17 DIAGNOSIS — O0933 Supervision of pregnancy with insufficient antenatal care, third trimester: Secondary | ICD-10-CM

## 2015-04-17 DIAGNOSIS — Z369 Encounter for antenatal screening, unspecified: Secondary | ICD-10-CM

## 2015-04-17 DIAGNOSIS — O0930 Supervision of pregnancy with insufficient antenatal care, unspecified trimester: Secondary | ICD-10-CM | POA: Insufficient documentation

## 2015-04-17 HISTORY — DX: Personal history of other infectious and parasitic diseases: Z86.19

## 2015-04-17 LAB — POCT URINALYSIS DIPSTICK
Blood, UA: NEGATIVE
Glucose, UA: NEGATIVE
Ketones, UA: NEGATIVE
Leukocytes, UA: NEGATIVE
Nitrite, UA: NEGATIVE
Protein, UA: NEGATIVE

## 2015-04-17 NOTE — Progress Notes (Signed)
  Subjective:  Brooke Hunt is a 24 y.o. G2P1001 African American female at 8249w5d by 30.6wk u/s, being seen today for her first obstetrical visit.  States she didn't know she was pregnant until about 1 month ago, was taking COCs daily- never missed any- reports she still had periods q month although lighter. Her obstetrical history is significant for smoker: quit when she found out about pregnancy ~151mth ago, short interval pregnancy: uncomplicated term svb 06/17/14.  Pregnancy history fully reviewed.  Patient reports no complaints. Denies vb, cramping, uti s/s, abnormal/malodorous vag d/c, or vulvovaginal itching/irritation.  BP 140/70 mmHg  Pulse 92  Wt 182 lb (82.555 kg)  LMP 01/31/2015  HISTORY: OB History  Gravida Para Term Preterm AB SAB TAB Ectopic Multiple Living  2 1 1       0 1    # Outcome Date GA Lbr Len/2nd Weight Sex Delivery Anes PTL Lv  2 Current           1 Term 06/17/14 6091w4d 15:35 / 00:12 6 lb 12.5 oz (3.075 kg) M Vag-Spont None N Y     Past Medical History  Diagnosis Date  . Medical history non-contributory   . Contraceptive management 07/27/2014  . Pregnant 04/03/2015   Past Surgical History  Procedure Laterality Date  . No past surgeries     Family History  Problem Relation Age of Onset  . Arthritis Maternal Grandmother   . Dementia Maternal Grandmother   . Sickle cell trait Son   . Diabetes Paternal Grandmother     Exam   System:     General: Well developed & nourished, no acute distress   Skin: Warm & dry, normal coloration and turgor, no rashes   Neurologic: Alert & oriented, normal mood   Cardiovascular: Regular rate & rhythm   Respiratory: Effort & rate normal, LCTAB, acyanotic   Abdomen: Soft, non tender   Extremities: normal strength, tone  Thin prep pap smear neg 2015  FH: 30cm FHR: 153 via doppler   Assessment:   Pregnancy: G2P1001 Patient Active Problem List   Diagnosis Date Noted  . Supervision of normal first pregnancy 01/10/2014     Priority: High  . Short interval between pregnancies affecting pregnancy, antepartum 04/17/2015  . History of chlamydia 04/17/2015  . Susceptible to varicella (non-immune), currently pregnant 01/20/2014    2749w5d G2P1001 New OB visit Short interval pregnancy Recent smoker Late care  Plan:  Initial labs drawn Continue prenatal vitamins Problem list reviewed and updated Reviewed n/v relief measures and warning s/s to report Reviewed recommended weight gain based on pre-gravid BMI Encouraged well-balanced diet Genetic Screening discussed Quad Screen: too late Cystic fibrosis screening discussed results reviewed, neg last preg Ultrasound discussed; fetal survey: results reviewed Follow up on 11/29 for 2hr gtt and visit/check bp CCNC completed  Marge DuncansBooker, Kimberly Randall CNM, [facility]WHNP-BC 04/17/2015 11:00 AM

## 2015-04-17 NOTE — Patient Instructions (Signed)
You will have your sugar test next visit.  Please do not eat or drink anything after midnight the night before you come, not even water.  You will be here for at least two hours.     Call the office ([phone]) or go to [facility] if:  You begin to have strong, frequent contractions  Your water breaks.  Sometimes it is a big gush of fluid, sometimes it is just a trickle that keeps getting your panties wet or running down your legs  You have vaginal bleeding.  It is normal to have a small amount of spotting if your cervix was checked.   You don't feel your baby moving like normal.  If you don't, get you something to eat and drink and lay down and focus on feeling your baby move.  You should feel at least 10 movements in 2 hours.  If you don't, you should call the office or go to [facility].    Tdap Vaccine  It is recommended that you get the Tdap vaccine during the third trimester of EACH pregnancy to help protect your baby from getting pertussis (whooping cough)  27-36 weeks is the BEST time to do this so that you can pass the protection on to your baby. During pregnancy is better than after pregnancy, but if you are unable to get it during pregnancy it will be offered at the hospital.   You can get this vaccine at the health department or your family doctor  Everyone who will be around your baby should also be up-to-date on their vaccines. Adults (who are not pregnant) only need 1 dose of Tdap during adulthood.   Third Trimester of Pregnancy The third trimester is from week 29 through week 42, months 7 through 9. The third trimester is a time when the fetus is growing rapidly. At the end of the ninth month, the fetus is about 20 inches in length and weighs 6-10 pounds.  BODY CHANGES Your body goes through many changes during pregnancy. The changes vary from woman to woman.   Your weight will continue to increase. You can expect to gain 25-35 pounds (11-16 kg) by the end of the  pregnancy.  You may begin to get stretch marks on your hips, abdomen, and breasts.  You may urinate more often because the fetus is moving lower into your pelvis and pressing on your bladder.  You may develop or continue to have heartburn as a result of your pregnancy.  You may develop constipation because certain hormones are causing the muscles that push waste through your intestines to slow down.  You may develop hemorrhoids or swollen, bulging veins (varicose veins).  You may have pelvic pain because of the weight gain and pregnancy hormones relaxing your joints between the bones in your pelvis. Backaches may result from overexertion of the muscles supporting your posture.  You may have changes in your hair. These can include thickening of your hair, rapid growth, and changes in texture. Some women also have hair loss during or after pregnancy, or hair that feels dry or thin. Your hair will most likely return to normal after your baby is born.  Your breasts will continue to grow and be tender. A yellow discharge may leak from your breasts called colostrum.  Your belly button may stick out.  You may feel short of breath because of your expanding uterus.  You may notice the fetus "dropping," or moving lower in your abdomen.  You may have   a bloody mucus discharge. This usually occurs a few days to a week before labor begins.  Your cervix becomes thin and soft (effaced) near your due date. WHAT TO EXPECT AT YOUR PRENATAL EXAMS  You will have prenatal exams every 2 weeks until week 36. Then, you will have weekly prenatal exams. During a routine prenatal visit:  You will be weighed to make sure you and the fetus are growing normally.  Your blood pressure is taken.  Your abdomen will be measured to track your baby's growth.  The fetal heartbeat will be listened to.  Any test results from the previous visit will be discussed.  You may have a cervical check near your due date to  see if you have effaced. At around 36 weeks, your caregiver will check your cervix. At the same time, your caregiver will also perform a test on the secretions of the vaginal tissue. This test is to determine if a type of bacteria, Group B streptococcus, is present. Your caregiver will explain this further. Your caregiver may ask you:  What your birth plan is.  How you are feeling.  If you are feeling the baby move.  If you have had any abnormal symptoms, such as leaking fluid, bleeding, severe headaches, or abdominal cramping.  If you are using any tobacco products, including cigarettes, chewing tobacco, and electronic cigarettes.  If you have any questions. Other tests or screenings that may be performed during your third trimester include:  Blood tests that check for low iron levels (anemia).  Fetal testing to check the health, activity level, and growth of the fetus. Testing is done if you have certain medical conditions or if there are problems during the pregnancy.  HIV (human immunodeficiency virus) testing. If you are at high risk, you may be screened for HIV during your third trimester of pregnancy. FALSE LABOR You may feel small, irregular contractions that eventually go away. These are called Braxton Hicks contractions, or false labor. Contractions may last for hours, days, or even weeks before true labor sets in. If contractions come at regular intervals, intensify, or become painful, it is best to be seen by your caregiver.  SIGNS OF LABOR   Menstrual-like cramps.  Contractions that are 5 minutes apart or less.  Contractions that start on the top of the uterus and spread down to the lower abdomen and back.  A sense of increased pelvic pressure or back pain.  A watery or bloody mucus discharge that comes from the vagina. If you have any of these signs before the 37th week of pregnancy, call your caregiver right away. You need to go to the hospital to get checked  immediately. HOME CARE INSTRUCTIONS   Avoid all smoking, herbs, alcohol, and unprescribed drugs. These chemicals affect the formation and growth of the baby.  Do not use any tobacco products, including cigarettes, chewing tobacco, and electronic cigarettes. If you need help quitting, ask your health care provider. You may receive counseling support and other resources to help you quit.  Follow your caregiver's instructions regarding medicine use. There are medicines that are either safe or unsafe to take during pregnancy.  Exercise only as directed by your caregiver. Experiencing uterine cramps is a good sign to stop exercising.  Continue to eat regular, healthy meals.  Wear a good support bra for breast tenderness.  Do not use hot tubs, steam rooms, or saunas.  Wear your seat belt at all times when driving.  Avoid raw meat, uncooked cheese,   cat litter boxes, and soil used by cats. These carry germs that can cause birth defects in the baby.  Take your prenatal vitamins.  Take 1500-2000 mg of calcium daily starting at the 20th week of pregnancy until you deliver your baby.  Try taking a stool softener (if your caregiver approves) if you develop constipation. Eat more high-fiber foods, such as fresh vegetables or fruit and whole grains. Drink plenty of fluids to keep your urine clear or pale yellow.  Take warm sitz baths to soothe any pain or discomfort caused by hemorrhoids. Use hemorrhoid cream if your caregiver approves.  If you develop varicose veins, wear support hose. Elevate your feet for 15 minutes, 3-4 times a day. Limit salt in your diet.  Avoid heavy lifting, wear low heal shoes, and practice good posture.  Rest a lot with your legs elevated if you have leg cramps or low back pain.  Visit your dentist if you have not gone during your pregnancy. Use a soft toothbrush to brush your teeth and be gentle when you floss.  A sexual relationship may be continued unless your  caregiver directs you otherwise.  Do not travel far distances unless it is absolutely necessary and only with the approval of your caregiver.  Take prenatal classes to understand, practice, and ask questions about the labor and delivery.  Make a trial run to the hospital.  Pack your hospital bag.  Prepare the baby's nursery.  Continue to go to all your prenatal visits as directed by your caregiver. SEEK MEDICAL CARE IF:  You are unsure if you are in labor or if your water has broken.  You have dizziness.  You have mild pelvic cramps, pelvic pressure, or nagging pain in your abdominal area.  You have persistent nausea, vomiting, or diarrhea.  You have a bad smelling vaginal discharge.  You have pain with urination. SEEK IMMEDIATE MEDICAL CARE IF:   You have a fever.  You are leaking fluid from your vagina.  You have spotting or bleeding from your vagina.  You have severe abdominal cramping or pain.  You have rapid weight loss or gain.  You have shortness of breath with chest pain.  You notice sudden or extreme swelling of your face, hands, ankles, feet, or legs.  You have not felt your baby move in over an hour.  You have severe headaches that do not go away with medicine.  You have vision changes.   This information is not intended to replace advice given to you by your health care provider. Make sure you discuss any questions you have with your health care provider.   Document Released: 05/05/2001 Document Revised: 06/01/2014 Document Reviewed: 07/12/2012 Elsevier Interactive Patient Education 2016 [employer] Inc[employer]     

## 2015-04-18 LAB — URINALYSIS, ROUTINE W REFLEX MICROSCOPIC
Bilirubin, UA: NEGATIVE
Glucose, UA: NEGATIVE
Ketones, UA: NEGATIVE
Leukocytes, UA: NEGATIVE
NITRITE UA: NEGATIVE
PH UA: 7.5 (ref 5.0–7.5)
Protein, UA: NEGATIVE
RBC, UA: NEGATIVE
Specific Gravity, UA: 1.007 (ref 1.005–1.030)
UUROB: 0.2 mg/dL (ref 0.2–1.0)

## 2015-04-18 LAB — CBC
Hematocrit: 31.4 % — ABNORMAL LOW (ref 34.0–46.6)
Hemoglobin: 10 g/dL — ABNORMAL LOW (ref 11.1–15.9)
MCH: 26.5 pg — ABNORMAL LOW (ref 26.6–33.0)
MCHC: 31.8 g/dL (ref 31.5–35.7)
MCV: 83 fL (ref 79–97)
PLATELETS: 251 10*3/uL (ref 150–379)
RBC: 3.77 x10E6/uL (ref 3.77–5.28)
RDW: 15.5 % — AB (ref 12.3–15.4)
WBC: 12.2 10*3/uL — AB (ref 3.4–10.8)

## 2015-04-18 LAB — PMP SCREEN PROFILE (10S), URINE
AMPHETAMINE SCRN UR: NEGATIVE ng/mL
BARBITURATE SCRN UR: NEGATIVE ng/mL
Benzodiazepine Screen, Urine: NEGATIVE ng/mL
CANNABINOIDS UR QL SCN: POSITIVE ng/mL
COCAINE(METAB.) SCREEN, URINE: NEGATIVE ng/mL
CREATININE(CRT), U: 52.8 mg/dL (ref 20.0–300.0)
Methadone Scn, Ur: NEGATIVE ng/mL
OPIATE SCRN UR: NEGATIVE ng/mL
Oxycodone+Oxymorphone Ur Ql Scn: NEGATIVE ng/mL
PCP Scrn, Ur: NEGATIVE ng/mL
PROPOXYPHENE SCREEN: NEGATIVE ng/mL
Ph of Urine: 7.4 (ref 4.5–8.9)

## 2015-04-18 LAB — HEPATITIS B SURFACE ANTIGEN: HEP B S AG: NEGATIVE

## 2015-04-18 LAB — RPR: RPR Ser Ql: NONREACTIVE

## 2015-04-18 LAB — HIV ANTIBODY (ROUTINE TESTING W REFLEX): HIV SCREEN 4TH GENERATION: NONREACTIVE

## 2015-04-18 LAB — ABO/RH: RH TYPE: POSITIVE

## 2015-04-18 LAB — VARICELLA ZOSTER ANTIBODY, IGG: Varicella zoster IgG: <135 index — ABNORMAL LOW (ref >165)

## 2015-04-18 LAB — ANTIBODY SCREEN: ANTIBODY SCREEN: NEGATIVE

## 2015-04-18 LAB — RUBELLA SCREEN: Rubella Antibodies, IGG: 1.58 index (ref >0.99)

## 2015-04-19 LAB — GC/CHLAMYDIA PROBE AMP
CHLAMYDIA, DNA PROBE: NEGATIVE
Neisseria gonorrhoeae by PCR: NEGATIVE

## 2015-04-19 LAB — URINE CULTURE: Organism ID, Bacteria: NO GROWTH

## 2015-04-23 ENCOUNTER — Telehealth: Payer: Self-pay | Admitting: *Deleted

## 2015-04-23 ENCOUNTER — Other Ambulatory Visit: Payer: Self-pay | Admitting: Women's Health

## 2015-04-23 ENCOUNTER — Encounter: Payer: Self-pay | Admitting: Women's Health

## 2015-04-23 DIAGNOSIS — F1290 Cannabis use, unspecified, uncomplicated: Secondary | ICD-10-CM | POA: Insufficient documentation

## 2015-04-23 HISTORY — DX: Cannabis use, unspecified, uncomplicated: F12.90

## 2015-04-23 MED ORDER — FERROUS SULFATE 325 (65 FE) MG PO TABS: 325.0000 mg | ORAL_TABLET | Freq: Two times a day (BID) | ORAL | Status: DC

## 2015-05-02 ENCOUNTER — Other Ambulatory Visit: Payer: Medicaid Other

## 2015-05-02 ENCOUNTER — Ambulatory Visit (INDEPENDENT_AMBULATORY_CARE_PROVIDER_SITE_OTHER): Payer: Medicaid Other | Admitting: Advanced Practice Midwife

## 2015-05-02 VITALS — BP 122/72 | HR 82 | Wt 184.0 lb

## 2015-05-02 DIAGNOSIS — Z369 Encounter for antenatal screening, unspecified: Secondary | ICD-10-CM

## 2015-05-02 DIAGNOSIS — O09893 Supervision of other high risk pregnancies, third trimester: Secondary | ICD-10-CM

## 2015-05-02 DIAGNOSIS — O99333 Smoking (tobacco) complicating pregnancy, third trimester: Secondary | ICD-10-CM | POA: Diagnosis not present

## 2015-05-02 DIAGNOSIS — O0933 Supervision of pregnancy with insufficient antenatal care, third trimester: Secondary | ICD-10-CM

## 2015-05-02 DIAGNOSIS — Z1389 Encounter for screening for other disorder: Secondary | ICD-10-CM

## 2015-05-02 DIAGNOSIS — Z331 Pregnant state, incidental: Secondary | ICD-10-CM

## 2015-05-02 DIAGNOSIS — Z3493 Encounter for supervision of normal pregnancy, unspecified, third trimester: Secondary | ICD-10-CM

## 2015-05-02 DIAGNOSIS — Z3A34 34 weeks gestation of pregnancy: Secondary | ICD-10-CM | POA: Diagnosis not present

## 2015-05-02 DIAGNOSIS — Z131 Encounter for screening for diabetes mellitus: Secondary | ICD-10-CM

## 2015-05-02 LAB — POCT URINALYSIS DIPSTICK
GLUCOSE UA: NEGATIVE
Ketones, UA: NEGATIVE
Leukocytes, UA: NEGATIVE
NITRITE UA: NEGATIVE
PROTEIN UA: NEGATIVE
RBC UA: NEGATIVE

## 2015-05-02 NOTE — Progress Notes (Signed)
G2P1001 3325w6d Estimated Date of Delivery: 06/14/15  Blood pressure 122/72, pulse 82, weight 184 lb (83.462 kg), last menstrual period 01/31/2015, not currently breastfeeding.   BP weight and urine results all reviewed and noted.  Please refer to the obstetrical flow sheet for the fundal height and fetal heart rate documentation:  Patient reports good fetal movement, denies any bleeding and no rupture of membranes symptoms or regular contractions. Patient is without complaints. All questions were answered.  Orders Placed This Encounter  Procedures  . POCT urinalysis dipstick    Plan:  Continued routine obstetrical care, PN2 today   Return in about 2 weeks (around 05/16/2015) for LROB.

## 2015-05-03 LAB — CBC
Hematocrit: 29.8 % — ABNORMAL LOW (ref 34.0–46.6)
Hemoglobin: 9.8 g/dL — ABNORMAL LOW (ref 11.1–15.9)
MCH: 26.1 pg — AB (ref 26.6–33.0)
MCHC: 32.9 g/dL (ref 31.5–35.7)
MCV: 80 fL (ref 79–97)
PLATELETS: 263 10*3/uL (ref 150–379)
RBC: 3.75 x10E6/uL — ABNORMAL LOW (ref 3.77–5.28)
RDW: 14.9 % (ref 12.3–15.4)
WBC: 12.1 10*3/uL — AB (ref 3.4–10.8)

## 2015-05-03 LAB — GLUCOSE TOLERANCE, 2 HOURS W/ 1HR
GLUCOSE, 2 HOUR: 103 mg/dL (ref 65–152)
GLUCOSE, FASTING: 76 mg/dL (ref 65–91)
Glucose, 1 hour: 138 mg/dL (ref 65–179)

## 2015-05-03 LAB — HIV ANTIBODY (ROUTINE TESTING W REFLEX): HIV Screen 4th Generation wRfx: NONREACTIVE

## 2015-05-03 LAB — ANTIBODY SCREEN: Antibody Screen: NEGATIVE

## 2015-05-03 LAB — RPR: RPR: NONREACTIVE

## 2015-05-16 ENCOUNTER — Encounter: Payer: Self-pay | Admitting: Advanced Practice Midwife

## 2015-05-16 ENCOUNTER — Encounter: Payer: Medicaid Other | Admitting: Advanced Practice Midwife

## 2015-05-21 ENCOUNTER — Ambulatory Visit (INDEPENDENT_AMBULATORY_CARE_PROVIDER_SITE_OTHER): Payer: Medicaid Other | Admitting: Women's Health

## 2015-05-21 ENCOUNTER — Encounter: Payer: Self-pay | Admitting: Women's Health

## 2015-05-21 VITALS — BP 128/78 | HR 98 | Wt 184.0 lb

## 2015-05-21 DIAGNOSIS — Z1389 Encounter for screening for other disorder: Secondary | ICD-10-CM

## 2015-05-21 DIAGNOSIS — F1290 Cannabis use, unspecified, uncomplicated: Secondary | ICD-10-CM

## 2015-05-21 DIAGNOSIS — Z331 Pregnant state, incidental: Secondary | ICD-10-CM | POA: Diagnosis not present

## 2015-05-21 DIAGNOSIS — O0933 Supervision of pregnancy with insufficient antenatal care, third trimester: Secondary | ICD-10-CM

## 2015-05-21 DIAGNOSIS — O09893 Supervision of other high risk pregnancies, third trimester: Secondary | ICD-10-CM

## 2015-05-21 DIAGNOSIS — Z3493 Encounter for supervision of normal pregnancy, unspecified, third trimester: Secondary | ICD-10-CM

## 2015-05-21 DIAGNOSIS — F1210 Cannabis abuse, uncomplicated: Secondary | ICD-10-CM | POA: Diagnosis not present

## 2015-05-21 DIAGNOSIS — O99323 Drug use complicating pregnancy, third trimester: Secondary | ICD-10-CM

## 2015-05-21 DIAGNOSIS — Z369 Encounter for antenatal screening, unspecified: Secondary | ICD-10-CM

## 2015-05-21 DIAGNOSIS — Z3A36 36 weeks gestation of pregnancy: Secondary | ICD-10-CM

## 2015-05-21 LAB — POCT URINALYSIS DIPSTICK
Glucose, UA: NEGATIVE
KETONES UA: NEGATIVE
Leukocytes, UA: NEGATIVE
Nitrite, UA: NEGATIVE
PROTEIN UA: NEGATIVE
RBC UA: NEGATIVE

## 2015-05-21 NOTE — Progress Notes (Signed)
Low-risk OB appointment G2P1001 229w4d Estimated Date of Delivery: 06/14/15 BP 128/78 mmHg  Pulse 98  Wt 184 lb (83.462 kg)  LMP 01/31/2015  BP, weight, and urine reviewed.  Refer to obstetrical flow sheet for FH & FHR.  Reports good fm.  Denies regular uc's, lof, vb, or uti s/s. Thinks she may have hemorrhoids, no pain/bleeding- just doesn't feel right when she has bm, which is ~q2d. Takes otc stool softener prn.  No THC since mid Nov- advised no more- will repeat uds today Taking pnv & fe as directed GBS collected, very jumpy and anxious- had wanted SVE but will defer today ~3 swollen but non-thrombosed external hemorrhoids- to use otc hemorrhoid cream, gave printed info on constipation relief measures Vtx by [facility] and informal u/s (done b/c got fhr in RUQ and pt feels a lot of mvmt low) Reviewed ptl s/s, fkc. Plan:  Continue routine obstetrical care  F/U in 1wk for OB appointment

## 2015-05-21 NOTE — Patient Instructions (Signed)
Call the office [phone]) or go to [facility] if:  You begin to have strong, frequent contractions  Your water breaks.  Sometimes it is a big gush of fluid, sometimes it is just a trickle that keeps getting your panties wet or running down your legs  You have vaginal bleeding.  It is normal to have a small amount of spotting if your cervix was checked.   You don't feel your baby moving like normal.  If you don't, get you something to eat and drink and lay down and focus on feeling your baby move.  You should feel at least 10 movements in 2 hours.  If you don't, you should call the office or go to [facility].    Constipation  Drink plenty of fluid, preferably water, throughout the day  Eat foods high in fiber such as fruits, vegetables, and grains  Exercise, such as walking, is a good way to keep your bowels regular  Drink warm fluids, especially warm prune juice, or decaf coffee  Eat a 1/2 cup of real oatmeal (not instant), 1/2 cup applesauce, and 1/2-1 cup warm prune juice every day  If needed, you may take Colace (docusate sodium) stool softener once or twice a day to help keep the stool soft. If you are pregnant, wait until you are out of your first trimester (12-14 weeks of pregnancy)  If you still are having problems with constipation, you may take Miralax once daily as needed to help keep your bowels regular.  If you are pregnant, wait until you are out of your first trimester (12-14 weeks of pregnancy)   Preterm Labor Information Preterm labor is when labor starts at less than 37 weeks of pregnancy. The normal length of a pregnancy is 39 to 41 weeks. CAUSES Often, there is no identifiable underlying cause as to why a woman goes into preterm labor. One of the most common known causes of preterm labor is infection. Infections of the uterus, cervix, vagina, amniotic sac, bladder, kidney, or even the lungs (pneumonia) can cause labor to start. Other suspected causes of  preterm labor include:   Urogenital infections, such as yeast infections and bacterial vaginosis.   Uterine abnormalities (uterine shape, uterine septum, fibroids, or bleeding from the placenta).   A cervix that has been operated on (it may fail to stay closed).   Malformations in the fetus.   Multiple gestations (twins, triplets, and so on).   Breakage of the amniotic sac.  RISK FACTORS  Having a previous history of preterm labor.   Having premature rupture of membranes (PROM).   Having a placenta that covers the opening of the cervix (placenta previa).   Having a placenta that separates from the uterus (placental abruption).   Having a cervix that is too weak to hold the fetus in the uterus (incompetent cervix).   Having too much fluid in the amniotic sac (polyhydramnios).   Taking illegal drugs or smoking while pregnant.   Not gaining enough weight while pregnant.   Being younger than 29 and older than 23 years old.   Having a low socioeconomic status.   Being African American. SYMPTOMS Signs and symptoms of preterm labor include:   Menstrual-like cramps, abdominal pain, or back pain.  Uterine contractions that are regular, as frequent as six in an hour, regardless of their intensity (may be mild or painful).  Contractions that start on the top of the uterus and spread down to the lower abdomen and back.  A sense of increased pelvic pressure.   A watery or bloody mucus discharge that comes from the vagina.  TREATMENT Depending on the length of the pregnancy and other circumstances, your health care provider may suggest bed rest. If necessary, there are medicines that can be given to stop contractions and to mature the fetal lungs. If labor happens before 34 weeks of pregnancy, a prolonged hospital stay may be recommended. Treatment depends on the condition of both you and the fetus.  WHAT SHOULD YOU DO IF YOU THINK YOU ARE IN PRETERM  LABOR? Call your health care provider right away. You will need to go to the hospital to get checked immediately. HOW CAN YOU PREVENT PRETERM LABOR IN FUTURE PREGNANCIES? You should:   Stop smoking if you smoke.  Maintain healthy weight gain and avoid chemicals and drugs that are not necessary.  Be watchful for any type of infection.  Inform your health care provider if you have a known history of preterm labor.   This information is not intended to replace advice given to you by your health care provider. Make sure you discuss any questions you have with your health care provider.   Document Released: 08/01/2003 Document Revised: 01/11/2013 Document Reviewed: 06/13/2012 Elsevier Interactive Patient Education 2016 [employer].  Hemorrhoids Hemorrhoids are swollen veins around the rectum or anus. There are two types of hemorrhoids:   Internal hemorrhoids. These occur in the veins just inside the rectum. They may poke through to the outside and become irritated and painful.  External hemorrhoids. These occur in the veins outside the anus and can be felt as a painful swelling or hard lump near the anus. CAUSES  Pregnancy.   Obesity.   Constipation or diarrhea.   Straining to have a bowel movement.   Sitting for long periods on the toilet.  Heavy lifting or other activity that caused you to strain.  Anal intercourse. SYMPTOMS   Pain.   Anal itching or irritation.   Rectal bleeding.   Fecal leakage.   Anal swelling.   One or more lumps around the anus.  DIAGNOSIS  Your caregiver may be able to diagnose hemorrhoids by visual examination. Other examinations or tests that may be performed include:   Examination of the rectal area with a gloved hand (digital rectal exam).   Examination of anal canal using a small tube (scope).   A blood test if you have lost a significant amount of blood.  A test to look inside the colon (sigmoidoscopy or  colonoscopy). TREATMENT Most hemorrhoids can be treated at home. However, if symptoms do not seem to be getting better or if you have a lot of rectal bleeding, your caregiver may perform a procedure to help make the hemorrhoids get smaller or remove them completely. Possible treatments include:   Placing a rubber band at the base of the hemorrhoid to cut off the circulation (rubber band ligation).   Injecting a chemical to shrink the hemorrhoid (sclerotherapy).   Using a tool to burn the hemorrhoid (infrared light therapy).   Surgically removing the hemorrhoid (hemorrhoidectomy).   Stapling the hemorrhoid to block blood flow to the tissue (hemorrhoid stapling).  HOME CARE INSTRUCTIONS   Eat foods with fiber, such as whole grains, beans, nuts, fruits, and vegetables. Ask your doctor about taking products with added fiber in them (fibersupplements).  Increase fluid intake. Drink enough water and fluids to keep your urine clear or pale yellow.   Exercise regularly.   Go to  the bathroom when you have the urge to have a bowel movement. Do not wait.   Avoid straining to have bowel movements.   Keep the anal area dry and clean. Use wet toilet paper or moist towelettes after a bowel movement.   Medicated creams and suppositories may be used or applied as directed.   Only take over-the-counter or prescription medicines as directed by your caregiver.   Take warm sitz baths for 15-20 minutes, 3-4 times a day to ease pain and discomfort.   Place ice packs on the hemorrhoids if they are tender and swollen. Using ice packs between sitz baths may be helpful.   Put ice in a plastic bag.   Place a towel between your skin and the bag.   Leave the ice on for 15-20 minutes, 3-4 times a day.   Do not use a donut-shaped pillow or sit on the toilet for long periods. This increases blood pooling and pain.  SEEK MEDICAL CARE IF:  You have increasing pain and swelling that is not  controlled by treatment or medicine.  You have uncontrolled bleeding.  You have difficulty or you are unable to have a bowel movement.  You have pain or inflammation outside the area of the hemorrhoids. MAKE SURE YOU:  Understand these instructions.  Will watch your condition.  Will get help right away if you are not doing well or get worse.   This information is not intended to replace advice given to you by your health care provider. Make sure you discuss any questions you have with your health care provider.   Document Released: 05/08/2000 Document Revised: 04/27/2012 Document Reviewed: 03/15/2012 Elsevier Interactive Patient Education [employer]2016 Elsevier Inc.

## 2015-05-22 LAB — PMP SCREEN PROFILE (10S), URINE
AMPHETAMINE SCRN UR: NEGATIVE ng/mL
Barbiturate Screen, Ur: NEGATIVE ng/mL
Benzodiazepine Screen, Urine: NEGATIVE ng/mL
CANNABINOIDS UR QL SCN: POSITIVE ng/mL
COCAINE(METAB.) SCREEN, URINE: NEGATIVE ng/mL
Creatinine(Crt), U: 55 mg/dL (ref 20.0–300.0)
Methadone Scn, Ur: NEGATIVE ng/mL
OPIATE SCRN UR: NEGATIVE ng/mL
Oxycodone+Oxymorphone Ur Ql Scn: NEGATIVE ng/mL
PCP SCRN UR: NEGATIVE ng/mL
Ph of Urine: 7.3 (ref 4.5–8.9)
Propoxyphene, Screen: NEGATIVE ng/mL

## 2015-05-22 LAB — GC/CHLAMYDIA PROBE AMP
CHLAMYDIA, DNA PROBE: NEGATIVE
NEISSERIA GONORRHOEAE BY PCR: NEGATIVE

## 2015-05-23 LAB — STREP GP B NAA: Strep Gp B NAA: NEGATIVE

## 2015-05-26 HISTORY — PX: DILATION AND CURETTAGE OF UTERUS: SHX78

## 2015-05-28 ENCOUNTER — Encounter: Payer: Self-pay | Admitting: Advanced Practice Midwife

## 2015-05-28 ENCOUNTER — Ambulatory Visit (INDEPENDENT_AMBULATORY_CARE_PROVIDER_SITE_OTHER): Payer: Medicaid Other | Admitting: Advanced Practice Midwife

## 2015-05-28 VITALS — BP 140/82 | HR 92 | Wt 183.0 lb

## 2015-05-28 DIAGNOSIS — O09893 Supervision of other high risk pregnancies, third trimester: Secondary | ICD-10-CM

## 2015-05-28 DIAGNOSIS — O99323 Drug use complicating pregnancy, third trimester: Secondary | ICD-10-CM | POA: Diagnosis not present

## 2015-05-28 DIAGNOSIS — Z1389 Encounter for screening for other disorder: Secondary | ICD-10-CM | POA: Diagnosis not present

## 2015-05-28 DIAGNOSIS — Z3A38 38 weeks gestation of pregnancy: Secondary | ICD-10-CM

## 2015-05-28 DIAGNOSIS — O99333 Smoking (tobacco) complicating pregnancy, third trimester: Secondary | ICD-10-CM

## 2015-05-28 DIAGNOSIS — Z331 Pregnant state, incidental: Secondary | ICD-10-CM

## 2015-05-28 DIAGNOSIS — Z3493 Encounter for supervision of normal pregnancy, unspecified, third trimester: Secondary | ICD-10-CM

## 2015-05-28 LAB — POCT URINALYSIS DIPSTICK
Glucose, UA: NEGATIVE
Ketones, UA: NEGATIVE
Leukocytes, UA: NEGATIVE
Nitrite, UA: NEGATIVE
PROTEIN UA: NEGATIVE
RBC UA: NEGATIVE

## 2015-05-28 NOTE — Progress Notes (Signed)
G2P1001 3183w4d Estimated Date of Delivery: 06/14/15  Blood pressure 140/82, pulse 92, weight 183 lb (83.008 kg), last menstrual period 01/31/2015, not currently breastfeeding.   BP weight and urine results all reviewed and noted. Denies HA, RUQ pain, vision changes.  Please refer to the obstetrical flow sheet for the fundal height and fetal heart rate documentation:  Patient reports good fetal movement, denies any bleeding and no rupture of membranes symptoms or regular contractions. Patient is without complaints. All questions were answered.  Orders Placed This Encounter  Procedures  . POCT urinalysis dipstick    Plan:  Continued routine obstetrical care, reluctant to give dx of GHTN d/t normal diastolics.   Return in about 1 week (around 06/04/2015) for LROB.

## 2015-05-28 NOTE — Progress Notes (Signed)
Pt denies any problems or concerns at this time.  

## 2015-06-05 ENCOUNTER — Encounter: Payer: Self-pay | Admitting: *Deleted

## 2015-06-05 ENCOUNTER — Encounter: Payer: Medicaid Other | Admitting: Women's Health

## 2015-06-10 ENCOUNTER — Encounter: Payer: Medicaid Other | Admitting: Women's Health

## 2015-06-12 ENCOUNTER — Encounter: Payer: Self-pay | Admitting: Women's Health

## 2015-06-12 ENCOUNTER — Ambulatory Visit (INDEPENDENT_AMBULATORY_CARE_PROVIDER_SITE_OTHER): Payer: Medicaid Other | Admitting: Women's Health

## 2015-06-12 VITALS — BP 116/58 | HR 84 | Wt 185.0 lb

## 2015-06-12 DIAGNOSIS — Z3A40 40 weeks gestation of pregnancy: Secondary | ICD-10-CM | POA: Diagnosis not present

## 2015-06-12 DIAGNOSIS — O99333 Smoking (tobacco) complicating pregnancy, third trimester: Secondary | ICD-10-CM

## 2015-06-12 DIAGNOSIS — O99323 Drug use complicating pregnancy, third trimester: Secondary | ICD-10-CM

## 2015-06-12 DIAGNOSIS — Z331 Pregnant state, incidental: Secondary | ICD-10-CM

## 2015-06-12 DIAGNOSIS — O09893 Supervision of other high risk pregnancies, third trimester: Secondary | ICD-10-CM

## 2015-06-12 DIAGNOSIS — Z1389 Encounter for screening for other disorder: Secondary | ICD-10-CM | POA: Diagnosis not present

## 2015-06-12 DIAGNOSIS — Z3493 Encounter for supervision of normal pregnancy, unspecified, third trimester: Secondary | ICD-10-CM

## 2015-06-12 LAB — POCT URINALYSIS DIPSTICK
Blood, UA: NEGATIVE
Glucose, UA: NEGATIVE
Ketones, UA: NEGATIVE
LEUKOCYTES UA: NEGATIVE
NITRITE UA: NEGATIVE
Protein, UA: NEGATIVE

## 2015-06-12 NOTE — Progress Notes (Signed)
Low-risk OB appointment G2P1001 [redacted]w[redacted]d Estimated Date of Delivery: 06/14/15 BP 116/58 mmHg  Pulse 84  Wt 185 lb (83.915 kg)  LMP 01/31/2015  BP, weight, and urine reviewed.  Refer to obstetrical flow sheet for FH & FHR.  Reports good fm.  Denies regular uc's, lof, vb, or uti s/s. Hemorrhoid, has only used otc cream once or twice- to use bid as needed. Not thrombosed.  Declines SVE/membrane sweeping. Vtx by [facility] Reviewed labor s/s, fkc. Plan:  Continue routine obstetrical care, IOL scheduled for 1/27 @ 41wks @ 0700 for postdates if needed F/U in 1wk for OB appointment and nst for postdates

## 2015-06-12 NOTE — Patient Instructions (Signed)
Your induction is scheduled for 1/27 @ 7:00am. Go to [facility] hospital, Maternity Admissions Unit (Emergency) entrance and let them know you are there to be induced. They will send someone from Labor & Delivery to come get you.    Call the office [phone]) or go to [facility] if:  You begin to have strong, frequent contractions  Your water breaks.  Sometimes it is a big gush of fluid, sometimes it is just a trickle that keeps getting your panties wet or running down your legs  You have vaginal bleeding.  It is normal to have a small amount of spotting if your cervix was checked.   You don't feel your baby moving like normal.  If you don't, get you something to eat and drink and lay down and focus on feeling your baby move.  You should feel at least 10 movements in 2 hours.  If you don't, you should call the office or go to [facility].     [facility] Contractions Contractions of the uterus can occur throughout pregnancy. Contractions are not always a sign that you are in labor.  WHAT ARE BRAXTON HICKS CONTRACTIONS?  Contractions that occur before labor are called Braxton Hicks contractions, or false labor. Toward the end of pregnancy (32-34 weeks), these contractions can develop more often and may become more forceful. This is not true labor because these contractions do not result in opening (dilatation) and thinning of the cervix. They are sometimes difficult to tell apart from true labor because these contractions can be forceful and people have different pain tolerances. You should not feel embarrassed if you go to the hospital with false labor. Sometimes, the only way to tell if you are in true labor is for your health care provider to look for changes in the cervix. If there are no prenatal problems or other health problems associated with the pregnancy, it is completely safe to be sent home with false labor and await the onset of true labor. HOW CAN YOU TELL THE DIFFERENCE  BETWEEN TRUE AND FALSE LABOR? False Labor  The contractions of false labor are usually shorter and not as hard as those of true labor.   The contractions are usually irregular.   The contractions are often felt in the front of the lower abdomen and in the groin.   The contractions may go away when you walk around or change positions while lying down.   The contractions get weaker and are shorter lasting as time goes on.   The contractions do not usually become progressively stronger, regular, and closer together as with true labor.  True Labor  Contractions in true labor last 30-70 seconds, become very regular, usually become more intense, and increase in frequency.   The contractions do not go away with walking.   The discomfort is usually felt in the top of the uterus and spreads to the lower abdomen and low back.   True labor can be determined by your health care provider with an exam. This will show that the cervix is dilating and getting thinner.  WHAT TO REMEMBER  Keep up with your usual exercises and follow other instructions given by your health care provider.   Take medicines as directed by your health care provider.   Keep your regular prenatal appointments.   Eat and drink lightly if you think you are going into labor.   If Braxton Hicks contractions are making you uncomfortable:   Change your position from lying down  or resting to walking, or from walking to resting.   Sit and rest in a tub of warm water.   Drink 2-3 glasses of water. Dehydration may cause these contractions.   Do slow and deep breathing several times an hour.  WHEN SHOULD I SEEK IMMEDIATE MEDICAL CARE? Seek immediate medical care if:  Your contractions become stronger, more regular, and closer together.   You have fluid leaking or gushing from your vagina.   You have a fever.   You pass blood-tinged mucus.   You have vaginal bleeding.   You have continuous  abdominal pain.   You have low back pain that you never had before.   You feel your baby's head pushing down and causing pelvic pressure.   Your baby is not moving as much as it used to.    This information is not intended to replace advice given to you by your health care provider. Make sure you discuss any questions you have with your health care provider.   Document Released: 05/11/2005 Document Revised: 05/16/2013 Document Reviewed: 02/20/2013 Elsevier Interactive Patient Education [employer]

## 2015-06-14 ENCOUNTER — Telehealth: Payer: Self-pay | Admitting: *Deleted

## 2015-06-14 NOTE — Telephone Encounter (Signed)
Pt states "she feels like she is having Braxton Hicks contractions past 2 days, every 30 min to hour apart, +FM, no gush fluids. Informed pt to continue to monitor contractions, 5-10 min apart, gush of fluids go to [facility]. Pt verbalized understanding.

## 2015-06-17 ENCOUNTER — Telehealth (HOSPITAL_COMMUNITY): Payer: Self-pay | Admitting: *Deleted

## 2015-06-17 NOTE — Telephone Encounter (Signed)
Preadmission screen  

## 2015-06-19 ENCOUNTER — Other Ambulatory Visit: Payer: Medicaid Other | Admitting: Women's Health

## 2015-06-21 ENCOUNTER — Inpatient Hospital Stay (HOSPITAL_COMMUNITY)
Admission: RE | Admit: 2015-06-21 | Discharge: 2015-06-21 | Disposition: A | Discharge status: Home / Self Care | Payer: Medicaid Other | Source: Ambulatory Visit | Attending: Obstetrics and Gynecology | Admitting: Obstetrics and Gynecology

## 2015-06-21 NOTE — H&P (Signed)
error 

## 2015-06-25 ENCOUNTER — Telehealth: Payer: Self-pay | Admitting: *Deleted

## 2015-06-25 NOTE — Telephone Encounter (Signed)
Brooke Hunt from the [facility] Department states pt HgB today 10.5, SVD 06/15/2015. Pt is taking PNV and iron tablet daily.

## 2015-08-05 ENCOUNTER — Ambulatory Visit: Payer: Medicaid Other | Admitting: Women's Health

## 2015-08-05 ENCOUNTER — Encounter: Payer: Self-pay | Admitting: *Deleted

## 2015-08-26 ENCOUNTER — Encounter: Payer: Self-pay | Admitting: Women's Health

## 2015-08-26 ENCOUNTER — Ambulatory Visit: Payer: Medicaid Other | Admitting: Women's Health

## 2015-08-26 ENCOUNTER — Encounter: Payer: Self-pay | Admitting: *Deleted

## 2021-08-31 ENCOUNTER — Ambulatory Visit
Admission: EM | Admit: 2021-08-31 | Discharge: 2021-08-31 | Disposition: A | Discharge status: Home / Self Care | Payer: Self-pay | Attending: Family Medicine | Admitting: Family Medicine

## 2021-08-31 DIAGNOSIS — S39012A Strain of muscle, fascia and tendon of lower back, initial encounter: Secondary | ICD-10-CM

## 2021-08-31 DIAGNOSIS — S161XXA Strain of muscle, fascia and tendon at neck level, initial encounter: Secondary | ICD-10-CM

## 2021-08-31 MED ORDER — NAPROXEN 500 MG PO TABS: 500.0000 mg | ORAL_TABLET | Freq: Two times a day (BID) | ORAL | 0 refills | Status: DC | PRN

## 2021-08-31 MED ORDER — CYCLOBENZAPRINE HCL 5 MG PO TABS: 5.0000 mg | ORAL_TABLET | Freq: Three times a day (TID) | ORAL | 0 refills | Status: DC | PRN
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
## 2021-12-22 ENCOUNTER — Ambulatory Visit
Admission: EM | Admit: 2021-12-22 | Discharge: 2021-12-22 | Disposition: A | Discharge status: Home / Self Care | Payer: Medicaid Other | Attending: Nurse Practitioner | Admitting: Nurse Practitioner

## 2021-12-22 DIAGNOSIS — Z202 Contact with and (suspected) exposure to infections with a predominantly sexual mode of transmission: Secondary | ICD-10-CM

## 2021-12-22 DIAGNOSIS — Z113 Encounter for screening for infections with a predominantly sexual mode of transmission: Secondary | ICD-10-CM | POA: Diagnosis present

## 2021-12-22 LAB — POCT URINE PREGNANCY: Preg Test, Ur: NEGATIVE

## 2021-12-22 MED ORDER — DOXYCYCLINE HYCLATE 100 MG PO CAPS: 100.0000 mg | ORAL_CAPSULE | Freq: Two times a day (BID) | ORAL | 0 refills | Status: AC

## 2021-12-22 MED ORDER — CEFTRIAXONE SODIUM 500 MG IJ SOLR
500.0000 mg | Freq: Once | INTRAMUSCULAR | Status: AC
  Administered 2021-12-22: 500 mg via INTRAMUSCULAR

## 2021-12-22 NOTE — ED Provider Notes (Signed)
[facility]    CSN: [id number] Arrival date & time: 12/22/21  1502      History   Chief Complaint Chief Complaint  Patient presents with   SEXUALLY TRANSMITTED DISEASE    HPI Brooke Hunt is a 31 y.o. female.   Patient presents today for STI screening.  Reports her partner recently went for a physical, was routinely tested for STI and tested positive for chlamydia and gonorrhea.  She denies any symptoms today including vaginal discharge, vaginal irritation, dysuria, urinary frequency, fevers, nausea/vomiting, swelling in her groin.  Reports recent unprotected sex last week with the same partner.  She also denies any vaginal sores, lesions, or rashes.  She is currently on her menses.    Past Medical History:  Diagnosis Date   Contraceptive management 07/27/2014   Medical history non-contributory    Pregnant 04/03/2015    Patient Active Problem List   Diagnosis Date Noted   Marijuana use 04/23/2015   Short interval between pregnancies affecting pregnancy, antepartum 04/17/2015   History of chlamydia 04/17/2015   Late prenatal care affecting pregnancy 04/17/2015   Smoker 04/17/2015   Susceptible to varicella (non-immune), currently pregnant 01/20/2014    Past Surgical History:  Procedure Laterality Date   NO PAST SURGERIES      OB History     Gravida  2   Para  1   Term  1   Preterm      AB      Living  1      SAB      IAB      Ectopic      Multiple  0   Live Births  1            Home Medications    Prior to Admission medications   Medication Sig Start Date End Date Taking? Authorizing Provider  doxycycline (VIBRAMYCIN) 100 MG capsule Take 1 capsule (100 mg total) by mouth 2 (two) times daily for 7 days. 12/22/21 12/29/21 Yes Valentino Nose, NP  acetaminophen (TYLENOL) 325 MG tablet Take by mouth as needed.    [provider]  cyclobenzaprine (FLEXERIL) 5 MG tablet Take 1 tablet (5 mg total) by mouth 3 (three) times  daily as needed for muscle spasms. Do not drink alcohol or drive while taking this medication.  May cause drowsiness. 08/31/21   Particia Nearing, PA-C  ferrous sulfate 325 (65 FE) MG tablet Take 1 tablet (325 mg total) by mouth 2 (two) times daily with a meal. 04/23/15   Cheral Marker, CNM  naproxen (NAPROSYN) 500 MG tablet Take 1 tablet (500 mg total) by mouth 2 (two) times daily as needed. 08/31/21   Particia Nearing, PA-C  prenatal vitamin w/FE, FA (PRENATAL 1 + 1) 27-1 MG TABS tablet Take 1 tablet by mouth daily at 12 noon. 04/03/15   Adline Potter, NP    Family History Family History  Problem Relation Age of Onset   Arthritis Maternal Grandmother    Dementia Maternal Grandmother    Sickle cell trait Son    Diabetes Paternal Grandmother     Social History Social History   Tobacco Use   Smoking status: Some Days    Packs/day: 0.50    Years: 0.50    Total pack years: 0.25    Types: Cigarettes    Passive exposure: Never   Smokeless tobacco: Never  Vaping Use   Vaping Use: Some days   Substances: Nicotine, Flavoring  Substance Use Topics   Alcohol use: No    Comment: occ; not now   Drug use: No    Types: Marijuana     Allergies   Shellfish-derived products   Review of Systems Review of Systems Per HPI  Physical Exam Triage Vital Signs ED Triage Vitals  Enc Vitals Group     BP 12/22/21 1522 126/86     Pulse Rate 12/22/21 1522 77     Resp 12/22/21 1522 18     Temp 12/22/21 1522 98.5 F (36.9 C)     Temp src --      SpO2 12/22/21 1522 99 %     Weight --      Height --      Head Circumference --      Peak Flow --      Pain Score 12/22/21 1521 0     Pain Loc --      Pain Edu? --      Excl. in GC? --    No data found.  Updated Vital Signs BP 126/86   Pulse 77   Temp 98.5 F (36.9 C)   Resp 18   LMP 12/20/2021   SpO2 99%   Visual Acuity Right Eye Distance:   Left Eye Distance:   Bilateral Distance:    Right Eye Near:   Left  Eye Near:    Bilateral Near:     Physical Exam Vitals and nursing note reviewed.  Constitutional:      General: She is not in acute distress.    Appearance: Normal appearance. She is not toxic-appearing.  HENT:     Mouth/Throat:     Mouth: Mucous membranes are moist.     Pharynx: Oropharynx is clear.  Pulmonary:     Effort: Pulmonary effort is normal. No respiratory distress.  Genitourinary:    Comments: Deferred-self swab performed Skin:    General: Skin is warm and dry.     Coloration: Skin is not jaundiced or pale.     Findings: No erythema.  Neurological:     Mental Status: She is alert and oriented to person, place, and time.  Psychiatric:        Behavior: Behavior is cooperative.      UC Treatments / Results  Labs (all labs ordered are listed, but only abnormal results are displayed) Labs Reviewed  HIV ANTIBODY (ROUTINE TESTING W REFLEX)  RPR  POCT URINE PREGNANCY  CERVICOVAGINAL ANCILLARY ONLY    EKG   Radiology No results found.  Procedures Procedures (including critical care time)  Medications Ordered in UC Medications  cefTRIAXone (ROCEPHIN) injection 500 mg (500 mg Intramuscular Given 12/22/21 1557)    Initial Impression / Assessment and Plan / UC Course  I have reviewed the triage vital signs and the nursing notes.  Pertinent labs & imaging results that were available during my care of the patient were reviewed by me and considered in my medical decision making (see chart for details).    Patient is a very pleasant, well-appearing 31 year old female presenting for STI screening today.  Urine pregnancy test today negative.  Self swab obtained to check for chlamydia, gonorrhea, trichomonas, bacterial vaginosis, yeast vaginitis.  We will also check blood work for HIV and syphilis.  Today, we are treating empirically for gonorrhea and chlamydia given known exposures.  IM ceftriaxone 500 mg given in urgent care, prescription sent for doxycycline and I  instructed her to start this.  Remain abstinent from sexual intercourse until  treatment is finished.  Condoms given.  The patient was given the opportunity to ask questions.  All questions answered to their satisfaction.  The patient is in agreement to this plan.   Final Clinical Impressions(s) / UC Diagnoses   Final diagnoses:  Exposure to gonorrhea  Exposure to chlamydia  Routine screening for STI (sexually transmitted infection)     Discharge Instructions      - We are screening you today for gonorrhea, chlamydia, trichomonas, yeast infection, and bacterial vaginosis.  We are also checking your blood for HIV and syphilis.  We will call you with positive results. -Since you have been exposed to chlamydia and gonorrhea, we have treated you for gonorrhea.  Please pick up the doxycycline and take this twice daily for 7 days until you hear from [place].  This is a treatment for chlamydia.  Do not have sexual intercourse until you finish treatment all of the way.  - Use condoms with every sexual encounter to prevent STI -Urine pregnancy test is negative.     ED Prescriptions     Medication Sig Dispense Auth. Provider   doxycycline (VIBRAMYCIN) 100 MG capsule Take 1 capsule (100 mg total) by mouth 2 (two) times daily for 7 days. 14 capsule Valentino Nose, NP      PDMP not reviewed this encounter.   Valentino Nose, NP 12/22/21 [phone]

## 2021-12-22 NOTE — Discharge Instructions (Addendum)
-   We are screening you today for gonorrhea, chlamydia, trichomonas, yeast infection, and bacterial vaginosis.  We are also checking your blood for HIV and syphilis.  We will call you with positive results. -Since you have been exposed to chlamydia and gonorrhea, we have treated you for gonorrhea.  Please pick up the doxycycline and take this twice daily for 7 days until you hear from [place].  This is a treatment for chlamydia.  Do not have sexual intercourse until you finish treatment all of the way.  - Use condoms with every sexual encounter to prevent STI -Urine pregnancy test is negative.

## 2021-12-22 NOTE — ED Triage Notes (Signed)
Pt request std screening, denies any symptoms but states partner tested positive for chlamydia and gonorrhea

## 2021-12-23 LAB — HIV ANTIBODY (ROUTINE TESTING W REFLEX): HIV Screen 4th Generation wRfx: NONREACTIVE

## 2021-12-23 LAB — RPR: RPR Ser Ql: NONREACTIVE

## 2021-12-26 LAB — CERVICOVAGINAL ANCILLARY ONLY
Bacterial Vaginitis (gardnerella): POSITIVE — AB
Candida Glabrata: NEGATIVE
Candida Vaginitis: NEGATIVE
Chlamydia: NEGATIVE
Comment: NEGATIVE
Comment: NEGATIVE
Comment: NEGATIVE
Comment: NEGATIVE
Comment: NEGATIVE
Comment: NORMAL
Neisseria Gonorrhea: NEGATIVE
Trichomonas: NEGATIVE

## 2021-12-29 ENCOUNTER — Telehealth (HOSPITAL_COMMUNITY): Payer: Self-pay | Admitting: Emergency Medicine

## 2021-12-29 MED ORDER — METRONIDAZOLE 500 MG PO TABS: 500.0000 mg | ORAL_TABLET | Freq: Two times a day (BID) | ORAL | 0 refills | Status: DC

## 2022-01-23 ENCOUNTER — Ambulatory Visit: Payer: Medicaid Other | Admitting: Obstetrics & Gynecology

## 2022-04-24 ENCOUNTER — Encounter (HOSPITAL_COMMUNITY): Payer: Self-pay | Admitting: *Deleted

## 2022-04-24 ENCOUNTER — Other Ambulatory Visit: Payer: Self-pay

## 2022-04-24 ENCOUNTER — Emergency Department (HOSPITAL_COMMUNITY)
Admission: EM | Admit: 2022-04-24 | Discharge: 2022-04-24 | Disposition: A | Discharge status: Home / Self Care | Payer: Medicaid Other | Attending: Emergency Medicine | Admitting: Emergency Medicine

## 2022-04-24 DIAGNOSIS — K0889 Other specified disorders of teeth and supporting structures: Secondary | ICD-10-CM | POA: Diagnosis present

## 2022-04-24 MED ORDER — AMOXICILLIN 500 MG PO CAPS: 500.0000 mg | ORAL_CAPSULE | Freq: Three times a day (TID) | ORAL | 0 refills | Status: DC

## 2022-04-24 MED ORDER — IBUPROFEN 600 MG PO TABS: 600.0000 mg | ORAL_TABLET | Freq: Three times a day (TID) | ORAL | 0 refills | Status: AC | PRN

## 2022-04-24 NOTE — ED Provider Notes (Signed)
[facility] EMERGENCY DEPARTMENT Provider Note   CSN: [id number] Arrival date & time: 04/24/22  2005     History    Brooke Hunt is a 31 y.o. female with past medical history of tobacco abuse who presents to the ED complaining of left-sided dental pain and left ear pain for the last week.  Patient states this happened before and she was diagnosed with left otitis media which required antibiotic prescription to clear.  She says this was several months ago and she has not been on antibiotics since that time.  She denies fever, chills, nausea, vomiting, abdominal pain, trouble swallowing, or sore throat.  She says she last saw a dentist approximately 2 years ago and was made aware that she had many dental caries and fractured wisdom tooth on the left that would require future intervention.  She has been unable to return to dentist for these procedures since that time.  She has been taking ibuprofen 800 mg at home for her dental and ear pain and says this adequately relieves the pain but she has run out of this medication.    Home Medications Prior to Admission medications   Medication Sig Start Date End Date Taking? Authorizing Provider  amoxicillin (AMOXIL) 500 MG capsule Take 1 capsule (500 mg total) by mouth 3 (three) times daily. 04/24/22  Yes Akaila Rambo L, PA-C  ibuprofen (ADVIL) 600 MG tablet Take 1 tablet (600 mg total) by mouth every 8 (eight) hours as needed for up to 7 days for fever, moderate pain or mild pain. 04/24/22 05/01/22 Yes Callie Bunyard L, PA-C  acetaminophen (TYLENOL) 325 MG tablet Take by mouth as needed.    [provider]  cyclobenzaprine (FLEXERIL) 5 MG tablet Take 1 tablet (5 mg total) by mouth 3 (three) times daily as needed for muscle spasms. Do not drink alcohol or drive while taking this medication.  May cause drowsiness. 08/31/21   Volney American, PA-C  ferrous sulfate 325 (65 FE) MG tablet Take 1 tablet (325 mg total) by mouth 2 (two) times daily  with a meal. 04/23/15   Roma Schanz, CNM  metroNIDAZOLE (FLAGYL) 500 MG tablet Take 1 tablet (500 mg total) by mouth 2 (two) times daily. 12/29/21   LampteyMyrene Galas, MD  prenatal vitamin w/FE, FA (PRENATAL 1 + 1) 27-1 MG TABS tablet Take 1 tablet by mouth daily at 12 noon. 04/03/15   Estill Dooms, NP      Allergies    Shellfish-derived products    Review of Systems   Review of Systems  Constitutional:  Negative for chills and fever.  HENT:  Positive for dental problem and ear pain. Negative for sore throat.   Eyes:  Negative for pain and visual disturbance.  Respiratory:  Negative for cough and shortness of breath.   Cardiovascular:  Negative for chest pain and palpitations.  Gastrointestinal:  Negative for abdominal pain and vomiting.  Genitourinary:  Negative for dysuria and hematuria.  Musculoskeletal:  Negative for arthralgias and back pain.  Skin:  Negative for color change and rash.  Neurological:  Negative for seizures and syncope.  All other systems reviewed and are negative.   Physical Exam Updated Vital Signs BP 126/88 (BP Location: Left Arm)   Pulse 80   Temp 98.9 F (37.2 C) (Oral)   Resp 16   Ht 5\' 3"  (1.6 m)   Wt 88.9 kg   LMP 04/02/2022   SpO2 99%   BMI 34.72 kg/m  Physical Exam Vitals and nursing note reviewed.  Constitutional:      General: She is not in acute distress.    Appearance: Normal appearance. She is not ill-appearing or toxic-appearing.  HENT:     Head: Normocephalic and atraumatic.     Right Ear: Tympanic membrane, ear canal and external ear normal. There is no impacted cerumen.     Left Ear: Tympanic membrane, ear canal and external ear normal. There is no impacted cerumen.     Mouth/Throat:     Mouth: Mucous membranes are moist. No angioedema.     Dentition: No gingival swelling, dental abscesses or gum lesions.     Tongue: Lesions present.     Pharynx: Oropharynx is clear. No oropharyngeal exudate, posterior oropharyngeal  erythema or uvula swelling.     Tonsils: No tonsillar exudate or tonsillar abscesses.     Comments: Significant dental caries diffusely, worst to lower dentition, mild tenderness over left lower fractured wisdom tooth, no erythema, swelling, or identifiable abscess to gums, jawline, or neck Eyes:     Extraocular Movements: Extraocular movements intact.  Cardiovascular:     Rate and Rhythm: Normal rate and regular rhythm.     Heart sounds: Normal heart sounds. No murmur heard. Pulmonary:     Effort: Pulmonary effort is normal. No respiratory distress.     Breath sounds: Normal breath sounds. No wheezing, rhonchi or rales.  Musculoskeletal:        General: Normal range of motion.  Skin:    General: Skin is warm and dry.     Capillary Refill: Capillary refill takes less than 2 seconds.  Neurological:     Mental Status: She is alert. Mental status is at baseline.  Psychiatric:        Behavior: Behavior normal.     ED Results / Procedures / Treatments   Labs (all labs ordered are listed, but only abnormal results are displayed) None  EKG None  Radiology None  Procedures None  Medications Ordered in ED None  ED Course/ Medical Decision Making/ A&P                           Medical Decision Making Risk Prescription drug management.   31 year old female with PMH tobacco abuse presents c/o acute L sided dental and ear pain. She reports previously she had the same symptoms and was diagnosed with otitis media. No erythema, bulging, discharge, or signs of infection to either ear. Pt has diffuse dental disease worse on the lower dentition and particularly severe to fractured wisdom tooth on the left. Pt is well appearing and on exam there are no signs of dental abscess. Will cover pt with antibiotics due to significant disease and pt strongly encouraged to follow-up with a dentist immediately for further management of her symptoms as they will likely keep recurring until tooth is  surgically removed. Pt agreeable with plan and given information of dentist with instructions to follow-up within 48 hours. Aware she can manage the pain with over the counter analgesics as needed but prescription also provided. Instructed not to rely on NSAIDs long-term for her pain management. Tobacco cessation counseling provided.          Final Clinical Impression(s) / ED Diagnoses Final diagnoses:  Pain, dental    Rx / DC Orders ED Discharge Orders          Ordered    amoxicillin (AMOXIL) 500 MG capsule  3  times daily        04/24/22 2101    ibuprofen (ADVIL) 600 MG tablet  Every 8 hours PRN        04/24/22 2101              Turner Daniels 04/24/22 2129    Noemi Chapel, MD 04/27/22 1102

## 2022-04-24 NOTE — ED Triage Notes (Signed)
Pt left lower wisdom tooth broke, pain along left ear and jaw x 1 week

## 2022-04-24 NOTE — Discharge Instructions (Signed)
Please call provided dentist to arrange follow-up within 48 hours.  Take all of your antibiotics as prescribed.  Take ibuprofen as prescribed for pain as needed.

## 2022-07-29 ENCOUNTER — Ambulatory Visit
Admission: EM | Admit: 2022-07-29 | Discharge: 2022-07-29 | Disposition: A | Discharge status: Home / Self Care | Payer: PRIVATE HEALTH INSURANCE | Attending: Nurse Practitioner | Admitting: Nurse Practitioner

## 2022-07-29 DIAGNOSIS — Z3201 Encounter for pregnancy test, result positive: Secondary | ICD-10-CM

## 2022-07-29 DIAGNOSIS — Z3A01 Less than 8 weeks gestation of pregnancy: Secondary | ICD-10-CM | POA: Diagnosis not present

## 2022-07-29 LAB — POCT URINE PREGNANCY: Preg Test, Ur: POSITIVE — AB

## 2022-07-29 NOTE — ED Provider Notes (Signed)
[facility]    CSN: [id number] Arrival date & time: 07/29/22  1039      History   Chief Complaint Chief Complaint  Patient presents with   Possible Pregnancy    HPI Brooke Hunt is a 32 y.o. female.   The history is provided by the patient.   The patient presents for complaints of abnormal menses and possible pregnancy.  She states she had a normal period that started in January on the 26th of the month.  She states her last period started on February 26, but at that time, it was more like spotting.  She states, "I were 1 pad each day from February 26 through March 4", stating that she did not have to change her pad.  She states it was more tinged than anything.  She also admits to having some mild abdominal cramping.  Patient states for the past 2 weeks, she is also experienced morning sickness.  She denies fever, chills, chest pain, vomiting, or diarrhea.  Patient states that she currently has 2 children.  Patient states "I have been trying to get pregnant".   Patient Active Problem List   Diagnosis Date Noted   Marijuana use 04/23/2015   Short interval between pregnancies affecting pregnancy, antepartum 04/17/2015   History of chlamydia 04/17/2015   Late prenatal care affecting pregnancy 04/17/2015   Smoker 04/17/2015   Susceptible to varicella (non-immune), currently pregnant 01/20/2014    Past Surgical History:  Procedure Laterality Date   NO PAST SURGERIES      OB History     Gravida  2   Para  1   Term  1   Preterm      AB      Living  1      SAB      IAB      Ectopic      Multiple  0   Live Births  1            Home Medications    Prior to Admission medications   Medication Sig Start Date End Date Taking? Authorizing Provider  acetaminophen (TYLENOL) 325 MG tablet Take by mouth as needed.    [provider]  amoxicillin (AMOXIL) 500 MG capsule Take 1 capsule (500 mg total) by mouth 3 (three) times daily. 04/24/22    Gowens, Mariah L, PA-C  cyclobenzaprine (FLEXERIL) 5 MG tablet Take 1 tablet (5 mg total) by mouth 3 (three) times daily as needed for muscle spasms. Do not drink alcohol or drive while taking this medication.  May cause drowsiness. 08/31/21   Volney American, PA-C  ferrous sulfate 325 (65 FE) MG tablet Take 1 tablet (325 mg total) by mouth 2 (two) times daily with a meal. 04/23/15   Roma Schanz, CNM  metroNIDAZOLE (FLAGYL) 500 MG tablet Take 1 tablet (500 mg total) by mouth 2 (two) times daily. 12/29/21   LampteyMyrene Galas, MD  prenatal vitamin w/FE, FA (PRENATAL 1 + 1) 27-1 MG TABS tablet Take 1 tablet by mouth daily at 12 noon. 04/03/15   Estill Dooms, NP    Family History Family History  Problem Relation Age of Onset   Arthritis Maternal Grandmother    Dementia Maternal Grandmother    Sickle cell trait Son    Diabetes Paternal Grandmother     Social History Social History   Tobacco Use   Smoking status: Some Days    Packs/day: 0.50    Years:  0.50    Total pack years: 0.25    Types: Cigarettes    Passive exposure: Never   Smokeless tobacco: Never  Vaping Use   Vaping Use: Some days   Substances: Nicotine, Flavoring  Substance Use Topics   Alcohol use: No    Comment: occ; not now   Drug use: No    Types: Marijuana     Allergies   Shellfish-derived products   Review of Systems Review of Systems Per HPI  Physical Exam Triage Vital Signs ED Triage Vitals  Enc Vitals Group     BP 07/29/22 1148 (!) 129/93     Pulse Rate 07/29/22 1148 74     Resp 07/29/22 1148 16     Temp 07/29/22 1148 98.3 F (36.8 C)     Temp Source 07/29/22 1148 Oral     SpO2 07/29/22 1148 98 %     Weight --      Height --      Head Circumference --      Peak Flow --      Pain Score 07/29/22 1151 0     Pain Loc --      Pain Edu? --      Excl. in [place]? --    No data found.  Updated Vital Signs BP (!) 129/93 (BP Location: Right Arm)   Pulse 74   Temp 98.3 F (36.8 C)  (Oral)   Resp 16   LMP 07/20/2022 (Exact Date)   SpO2 98%   Visual Acuity Right Eye Distance:   Left Eye Distance:   Bilateral Distance:    Right Eye Near:   Left Eye Near:    Bilateral Near:     Physical Exam Vitals and nursing note reviewed.  Constitutional:      General: She is not in acute distress.    Appearance: Normal appearance.  HENT:     Head: Normocephalic.  Eyes:     Extraocular Movements: Extraocular movements intact.     Pupils: Pupils are equal, round, and reactive to light.  Abdominal:     General: Bowel sounds are normal.     Palpations: Abdomen is soft.  Skin:    General: Skin is warm and dry.  Neurological:     General: No focal deficit present.     Mental Status: She is alert and oriented to person, place, and time.  Psychiatric:        Mood and Affect: Mood normal.        Behavior: Behavior normal.      UC Treatments / Results  Labs (all labs ordered are listed, but only abnormal results are displayed) Labs Reviewed  POCT URINE PREGNANCY - Abnormal; Notable for the following components:      Result Value   Preg Test, Ur Positive (*)    All other components within normal limits    EKG   Radiology No results found.  Procedures Procedures (including critical care time)  Medications Ordered in UC Medications - No data to display  Initial Impression / Assessment and Plan / UC Course  I have reviewed the triage vital signs and the nursing notes.  Pertinent labs & imaging results that were available during my care of the patient were reviewed by me and considered in my medical decision making (see chart for details).  Patient is well-appearing, she is in no acute distress, vital signs are stable.  Patient with positive pregnancy test.  Patient was advised to begin prenatal care  as soon as possible.  She states [facility], and will follow-up with them for prenatal care.  Patient verbalizes understanding, all  questions were answered.  Patient stable for discharge.   Final Clinical Impressions(s) / UC Diagnoses   Final diagnoses:  Positive pregnancy test  Less than [redacted] weeks gestation of pregnancy     Discharge Instructions      Your pregnancy test was positive. Please follow-up with [facility] to establish care and for prenatal care. You can contact them at [phone]. Follow-up as needed.      ED Prescriptions   None    PDMP not reviewed this encounter.   Tish Men, NP 07/29/22 1238

## 2022-07-29 NOTE — Discharge Instructions (Signed)
Your pregnancy test was positive. Please follow-up with [facility] to establish care and for prenatal care. You can contact them at [phone]. Follow-up as needed.

## 2022-07-29 NOTE — ED Triage Notes (Signed)
Pt presents for pregnancy test as she is being spotting since 07/20/22. Reports morning sickness x 2 weeks.

## 2022-08-08 ENCOUNTER — Encounter: Payer: Self-pay | Admitting: Emergency Medicine

## 2022-08-08 ENCOUNTER — Ambulatory Visit
Admission: EM | Admit: 2022-08-08 | Discharge: 2022-08-08 | Disposition: A | Discharge status: Home / Self Care | Payer: PRIVATE HEALTH INSURANCE | Attending: Nurse Practitioner | Admitting: Nurse Practitioner

## 2022-08-08 DIAGNOSIS — O209 Hemorrhage in early pregnancy, unspecified: Secondary | ICD-10-CM

## 2022-08-08 DIAGNOSIS — N939 Abnormal uterine and vaginal bleeding, unspecified: Secondary | ICD-10-CM | POA: Diagnosis not present

## 2022-08-08 DIAGNOSIS — Z3A00 Weeks of gestation of pregnancy not specified: Secondary | ICD-10-CM | POA: Diagnosis not present

## 2022-08-08 DIAGNOSIS — Z3201 Encounter for pregnancy test, result positive: Secondary | ICD-10-CM | POA: Diagnosis not present

## 2022-08-08 LAB — POCT URINE PREGNANCY: Preg Test, Ur: POSITIVE — AB

## 2022-08-08 NOTE — ED Provider Notes (Signed)
[facility]    CSN: [id number] Arrival date & time: 08/08/22  0849      History   Chief Complaint No chief complaint on file.   HPI Brooke Hunt is a 32 y.o. female.   The history is provided by the patient.   The patient presents for abnormal bleeding.  Patient was seen in this clinic on 07/29/2022 and tested positive for pregnancy.  She states since that time, she has continued to have vaginal bleeding, she states she is using about 3-4 pads a day, stating that is like a "normal.".  She states last evening, she did pass a blood clot about the size of a quarter.  She states that she also had nausea after it happened.  She continues to complain of nausea.  She denies fever, chills, abdominal pain, abdominal cramping, vaginal discharge, vaginal odor, or urinary symptoms.  Patient states that she was scheduled to see OB on 08/20/2022.  Past Medical History:  Diagnosis Date   Contraceptive management 07/27/2014   Medical history non-contributory    Pregnant 04/03/2015    Patient Active Problem List   Diagnosis Date Noted   Marijuana use 04/23/2015   Short interval between pregnancies affecting pregnancy, antepartum 04/17/2015   History of chlamydia 04/17/2015   Late prenatal care affecting pregnancy 04/17/2015   Smoker 04/17/2015   Susceptible to varicella (non-immune), currently pregnant 01/20/2014    Past Surgical History:  Procedure Laterality Date   NO PAST SURGERIES      OB History     Gravida  2   Para  1   Term  1   Preterm      AB      Living  1      SAB      IAB      Ectopic      Multiple  0   Live Births  1            Home Medications    Prior to Admission medications   Medication Sig Start Date End Date Taking? Authorizing Provider  acetaminophen (TYLENOL) 325 MG tablet Take by mouth as needed.    [provider]  amoxicillin (AMOXIL) 500 MG capsule Take 1 capsule (500 mg total) by mouth 3 (three) times daily.  04/24/22   Gowens, Mariah L, PA-C  cyclobenzaprine (FLEXERIL) 5 MG tablet Take 1 tablet (5 mg total) by mouth 3 (three) times daily as needed for muscle spasms. Do not drink alcohol or drive while taking this medication.  May cause drowsiness. 08/31/21   Volney American, PA-C  ferrous sulfate 325 (65 FE) MG tablet Take 1 tablet (325 mg total) by mouth 2 (two) times daily with a meal. 04/23/15   Roma Schanz, CNM  metroNIDAZOLE (FLAGYL) 500 MG tablet Take 1 tablet (500 mg total) by mouth 2 (two) times daily. 12/29/21   LampteyMyrene Galas, MD  prenatal vitamin w/FE, FA (PRENATAL 1 + 1) 27-1 MG TABS tablet Take 1 tablet by mouth daily at 12 noon. 04/03/15   Estill Dooms, NP    Family History Family History  Problem Relation Age of Onset   Arthritis Maternal Grandmother    Dementia Maternal Grandmother    Sickle cell trait Son    Diabetes Paternal Grandmother     Social History Social History   Tobacco Use   Smoking status: Some Days    Packs/day: 0.50    Years: 0.50    Additional pack  years: 0.00    Total pack years: 0.25    Types: Cigarettes    Passive exposure: Never   Smokeless tobacco: Never  Vaping Use   Vaping Use: Some days   Substances: Nicotine, Flavoring  Substance Use Topics   Alcohol use: No    Comment: occ; not now   Drug use: No    Types: Marijuana     Allergies   Shellfish-derived products   Review of Systems Review of Systems Per HPI  Physical Exam Triage Vital Signs ED Triage Vitals  Enc Vitals Group     BP 08/08/22 0856 135/79     Pulse Rate 08/08/22 0856 75     Resp 08/08/22 0856 18     Temp 08/08/22 0856 97.9 F (36.6 C)     Temp Source 08/08/22 0856 Oral     SpO2 08/08/22 0856 98 %     Weight --      Height --      Head Circumference --      Peak Flow --      Pain Score 08/08/22 0858 0     Pain Loc --      Pain Edu? --      Excl. in [place]? --    No data found.  Updated Vital Signs BP 135/79 (BP Location: Right Arm)    Pulse 75   Temp 97.9 F (36.6 C) (Oral)   Resp 18   LMP 07/20/2022 (Exact Date)   SpO2 98%   Visual Acuity Right Eye Distance:   Left Eye Distance:   Bilateral Distance:    Right Eye Near:   Left Eye Near:    Bilateral Near:     Physical Exam Vitals and nursing note reviewed.  Constitutional:      Appearance: Normal appearance. She is not ill-appearing.  HENT:     Head: Normocephalic.  Eyes:     Extraocular Movements: Extraocular movements intact.     Pupils: Pupils are equal, round, and reactive to light.  Cardiovascular:     Rate and Rhythm: Normal rate and regular rhythm.     Pulses: Normal pulses.     Heart sounds: Normal heart sounds.  Pulmonary:     Effort: Pulmonary effort is normal.     Breath sounds: Normal breath sounds.  Abdominal:     General: Bowel sounds are normal.     Palpations: Abdomen is soft.     Tenderness: There is abdominal tenderness in the right lower quadrant. There is no guarding or rebound.  Musculoskeletal:     Cervical back: Normal range of motion.  Skin:    General: Skin is warm and dry.  Neurological:     General: No focal deficit present.     Mental Status: She is alert and oriented to person, place, and time.  Psychiatric:        Mood and Affect: Mood normal.        Behavior: Behavior normal.      UC Treatments / Results  Labs (all labs ordered are listed, but only abnormal results are displayed) Labs Reviewed  POCT URINE PREGNANCY - Abnormal; Notable for the following components:      Result Value   Preg Test, Ur Positive (*)    All other components within normal limits  BETA HCG QUANT (REF LAB)    EKG   Radiology No results found.  Procedures Procedures (including critical care time)  Medications Ordered in UC Medications - No data to  display  Initial Impression / Assessment and Plan / UC Course  I have reviewed the triage vital signs and the nursing notes.  Pertinent labs & imaging results that were  available during my care of the patient were reviewed by me and considered in my medical decision making (see chart for details).  The patient is well-appearing, she is in no acute distress, vital signs are stable.  Urine pregnancy test remains positive today.  hCG quantitative was collected to determine gestation.  No concern for acute abdomen at this time, symptoms most likely related to pregnancy.  Discussed patient's how results of the test will be interpreted to determine if she continues to have a viable pregnancy.  Patient was given strict ER follow-up precautions.  Patient advised to follow-up with the obstetrician if her results are normal as scheduled for 08/20/2022.  Patient verbalizes understanding, all questions were answered.  Patient stable for discharge.   Final Clinical Impressions(s) / UC Diagnoses   Final diagnoses:  Positive pregnancy test  Vaginal bleeding  First trimester bleeding     Discharge Instructions      The urine pregnancy test is still showing positive.  You will be contacted once the results of the blood work collected today are received to discuss.  You can also access your test results using MyChart.   Increase fluids and allow for plenty of rest. May take over-the-counter Tylenol as needed for pain, fever, or general discomfort. Go to the emergency department immediately if you develop increased clotting, heavier vaginal bleeding causing you to saturate more than 1 pad per hour with abdominal cramping. If all of your results show that your pregnancy is viable, keep appointment with OB on 08/20/2022. Follow-up as needed.     ED Prescriptions   None    PDMP not reviewed this encounter.   Tish Men, NP 08/08/22 [phone]

## 2022-08-08 NOTE — Discharge Instructions (Addendum)
The urine pregnancy test is still showing positive.  You will be contacted once the results of the blood work collected today are received to discuss.  You can also access your test results using MyChart.   Increase fluids and allow for plenty of rest. May take over-the-counter Tylenol as needed for pain, fever, or general discomfort. Go to the emergency department immediately if you develop increased clotting, heavier vaginal bleeding causing you to saturate more than 1 pad per hour with abdominal cramping. If all of your results show that your pregnancy is viable, keep appointment with OB on 08/20/2022. Follow-up as needed.

## 2022-08-08 NOTE — ED Triage Notes (Signed)
States she has had a period since March 6th.  States last night she  used the bathroom and passed a clot.  States she is 3 to 4 pads a day for the bleeding.  States she feels nauseated.

## 2022-08-09 LAB — BETA HCG QUANT (REF LAB): hCG Quant: 3975 m[IU]/mL

## 2022-08-11 ENCOUNTER — Other Ambulatory Visit: Payer: Self-pay | Admitting: Obstetrics & Gynecology

## 2022-08-11 DIAGNOSIS — O3680X0 Pregnancy with inconclusive fetal viability, not applicable or unspecified: Secondary | ICD-10-CM

## 2022-08-12 ENCOUNTER — Ambulatory Visit (INDEPENDENT_AMBULATORY_CARE_PROVIDER_SITE_OTHER): Payer: PRIVATE HEALTH INSURANCE

## 2022-08-12 ENCOUNTER — Encounter: Payer: Self-pay | Admitting: Obstetrics and Gynecology

## 2022-08-12 ENCOUNTER — Ambulatory Visit: Payer: PRIVATE HEALTH INSURANCE | Admitting: Obstetrics and Gynecology

## 2022-08-12 ENCOUNTER — Other Ambulatory Visit: Payer: Self-pay | Admitting: Obstetrics & Gynecology

## 2022-08-12 VITALS — BP 124/76 | HR 70 | Ht 63.0 in | Wt 196.6 lb

## 2022-08-12 DIAGNOSIS — Z3A01 Less than 8 weeks gestation of pregnancy: Secondary | ICD-10-CM

## 2022-08-12 DIAGNOSIS — K661 Hemoperitoneum: Secondary | ICD-10-CM

## 2022-08-12 DIAGNOSIS — O00109 Unspecified tubal pregnancy without intrauterine pregnancy: Secondary | ICD-10-CM | POA: Insufficient documentation

## 2022-08-12 DIAGNOSIS — O3680X0 Pregnancy with inconclusive fetal viability, not applicable or unspecified: Secondary | ICD-10-CM | POA: Diagnosis not present

## 2022-08-12 DIAGNOSIS — O00102 Left tubal pregnancy without intrauterine pregnancy: Secondary | ICD-10-CM

## 2022-08-12 HISTORY — DX: Hemoperitoneum: K66.1

## 2022-08-12 NOTE — Progress Notes (Signed)
Ms Halterman presents for OB U/S today. U/S suggestive of left ectopic pregnancy Pt reports no pain LMP 06/18/22 No contraception G2P2  PE AF VSS Lungs clear  Heart RRR Abd soft + BS   AP Probable left ectopic pregnancy  Dx reviewed with pt. Will send to MAU for further Tx such as MTX. [facility]U notified

## 2022-08-12 NOTE — Progress Notes (Addendum)
[place] TA/TV: homogeneous anteverted uterus,no IUP visualized,EEC 8.7 mm,normal right ovary,2.4 x 1.5 x 1.9 cm left adnexal mass adjacent to left ovary with color flow,small amount of cul de sac fluid  Chaperone Noel Journey

## 2022-08-14 ENCOUNTER — Inpatient Hospital Stay (HOSPITAL_COMMUNITY): Payer: PRIVATE HEALTH INSURANCE

## 2022-08-14 ENCOUNTER — Encounter (HOSPITAL_COMMUNITY): Payer: Self-pay

## 2022-08-14 ENCOUNTER — Inpatient Hospital Stay (HOSPITAL_COMMUNITY)
Admission: AD | Admit: 2022-08-14 | Discharge: 2022-08-14 | Disposition: A | Discharge status: Home / Self Care | Payer: PRIVATE HEALTH INSURANCE | Attending: Obstetrics & Gynecology | Admitting: Obstetrics & Gynecology

## 2022-08-14 DIAGNOSIS — O209 Hemorrhage in early pregnancy, unspecified: Secondary | ICD-10-CM | POA: Diagnosis not present

## 2022-08-14 DIAGNOSIS — O3680X0 Pregnancy with inconclusive fetal viability, not applicable or unspecified: Secondary | ICD-10-CM | POA: Diagnosis not present

## 2022-08-14 DIAGNOSIS — Z3A01 Less than 8 weeks gestation of pregnancy: Secondary | ICD-10-CM | POA: Diagnosis not present

## 2022-08-14 DIAGNOSIS — Z3A00 Weeks of gestation of pregnancy not specified: Secondary | ICD-10-CM | POA: Diagnosis not present

## 2022-08-14 DIAGNOSIS — Z3490 Encounter for supervision of normal pregnancy, unspecified, unspecified trimester: Secondary | ICD-10-CM

## 2022-08-14 LAB — CBC
HCT: 35.1 % — ABNORMAL LOW (ref 36.0–46.0)
Hemoglobin: 11.8 g/dL — ABNORMAL LOW (ref 12.0–15.0)
MCH: 27.6 pg (ref 26.0–34.0)
MCHC: 33.6 g/dL (ref 30.0–36.0)
MCV: 82.2 fL (ref 80.0–100.0)
Platelets: 335 10*3/uL (ref 150–400)
RBC: 4.27 MIL/uL (ref 3.87–5.11)
RDW: 14.7 % (ref 11.5–15.5)
WBC: 9.1 10*3/uL (ref 4.0–10.5)
nRBC: 0 % (ref 0.0–0.2)

## 2022-08-14 LAB — HCG, QUANTITATIVE, PREGNANCY: hCG, Beta Chain, Quant, S: 6561 m[IU]/mL — ABNORMAL HIGH (ref <5)

## 2022-08-14 NOTE — MAU Note (Signed)
...  Brooke Hunt is a 32 y.o. at Unknown here in MAU reporting: She reports she had an appointment with [facility] on 3/20. She reports she has been followed closely for ectopic pregnancy precautions. She reports she has been experiencing vaginal bleeding every day since 3/6 and just stopped yesterday. She reports she is here today to figure out what is going on and if she has an ectopic pregnancy or not.   Last quant on 3/16 was 3975.  Last [place] on 3/20 impression:  Intrauterine pregnancy not seen.  Left adnexal mass 2.4 x 1.5 x 1.9 cm with flow.  Small amount of free fluid noted in cul-de-sac.  -Records reviewed, in the setting of HCG level above 3000 on 08/08/2022, findings suggestive of ectopic pregnancy.  Recommend clinical correlation regarding management options.  Pain score: Denies pain.  Vitals:   08/14/22 1823  BP: (!) 123/50  Pulse: 75  Resp: 15  Temp: 99 F (37.2 C)  SpO2: 98%     Lab orders placed from triage:  none

## 2022-08-14 NOTE — MAU Provider Note (Signed)
History   Chief Complaint:  Vaginal Bleeding   Brooke Hunt is  32 y.o. [id number] Patient's last menstrual period was 06/19/2022 (exact date).. Patient is here for follow up of quantitative HCG and ongoing surveillance of pregnancy status. She was diagosed with a left ectopic pregnancy in the office on 3/20 and told to come to MAU for treatment. She reports she needed more time to think about it and came today for a second opinion.     Since her last visit, the patient is without new complaint. The patient reports bleeding as  none now.  She denies any pain.   General ROS:  negative   Her previous Quantitative HCG values are:  3/16: 3975  Physical Exam   Blood pressure (!) 123/50, pulse 75, temperature 99 F (37.2 C), temperature source Oral, resp. rate 15, height 5\' 3"  (1.6 m), weight 90.7 kg, last menstrual period 06/19/2022, SpO2 98 %.   Physical Exam Vitals and nursing note reviewed.  Constitutional:      General: She is not in acute distress.    Appearance: She is well-developed.  HENT:     Head: Normocephalic.  Eyes:     Pupils: Pupils are equal, round, and reactive to light.  Cardiovascular:     Rate and Rhythm: Normal rate and regular rhythm.  Pulmonary:     Effort: Pulmonary effort is normal. No respiratory distress.     Breath sounds: Normal breath sounds.  Abdominal:     Palpations: Abdomen is soft.     Tenderness: There is no abdominal tenderness.  Musculoskeletal:        General: Normal range of motion.     Cervical back: Normal range of motion.  Skin:    General: Skin is warm and dry.  Neurological:     Mental Status: She is alert and oriented to person, place, and time.  Psychiatric:        Behavior: Behavior normal.        Thought Content: Thought content normal.        Judgment: Judgment normal.     Labs: Results for orders placed or performed during the hospital encounter of 08/14/22 (from the past 24 hour(s))  CBC   Collection Time: 08/14/22   7:01 PM  Result Value Ref Range   WBC 9.1 4.0 - 10.5 K/uL   RBC 4.27 3.87 - 5.11 MIL/uL   Hemoglobin 11.8 (L) 12.0 - 15.0 g/dL   HCT 35.1 (L) 36.0 - 46.0 %   MCV 82.2 80.0 - 100.0 fL   MCH 27.6 26.0 - 34.0 pg   MCHC 33.6 30.0 - 36.0 g/dL   RDW 14.7 11.5 - 15.5 %   Platelets 335 150 - 400 K/uL   nRBC 0.0 0.0 - 0.2 %  hCG, quantitative, pregnancy   Collection Time: 08/14/22  7:01 PM  Result Value Ref Range   hCG, Beta Chain, Quant, S 6,561 (H) <5 mIU/mL     Ultrasound Studies:   US OB Transvaginal  Result Date: 08/14/2022 CLINICAL DATA:  vaginal bleeding pregnancy. Left adnexal mass on previous ultrasound EXAM: TRANSVAGINAL OB ULTRASOUND TECHNIQUE: Transvaginal ultrasound was performed for complete evaluation of the gestation as well as the maternal uterus, adnexal regions, and pelvic cul-de-sac. COMPARISON:  08/12/2022 FINDINGS: Intrauterine gestational sac: None Maternal uterus/adnexae: Anteverted uterus. No intrauterine gestational sac is identified. Bilateral ovaries appear within normal limits. The previously seen left adnexal mass is unable to be identified on the current exam. No adnexal masses  are seen. Trace free fluid within the cul-de-sac. IMPRESSION: 1. Previously seen left adnexal mass is not identified on the current exam. 2. No intrauterine pregnancy or adnexal mass identified. Findings are consistent with pregnancy of unknown location and may reflect early intrauterine pregnancy not yet visualized sonographically, occult ectopic pregnancy, or failed pregnancy. Recommend trending of beta HCG as well as follow-up ultrasound in 7-10 days based on clinical course. Electronically Signed   By: Davina Poke D.O.   On: 08/14/2022 19:46   US OB Comp Less 14 Wks  Result Date: 08/12/2022 Table formatting from the original result was not included.  ..an [employer] Diplomatic Services operational officer) accredited practice Center for [facility] @ [place] [address] Ordering Provider: Janyth Pupa, DO                                                                                                                     DATING AND VIABILITY SONOGRAM Brooke Hunt is a 32 y.o. year old G3P1001  Patient's last menstrual period was 06/19/2022 (exact date). which would correlate to  7+[redacted] weeks gestation.  She has regular menstrual cycles.   She is here today for a confirmatory initial sonogram. GESTATION: No IUP    FETAL ACTIVITY:          Heart rate         N/A         CERVIX: Appears closed ADNEXA: Normal right ovary,2.4 x 1.5 x 1.9 cm left adnexal mass adjacent to left ovary with color flow GESTATIONAL AGE AND  BIOMETRICS: Gestational criteria: Estimated Date of Delivery: 03/26/2023 by LMP now at 7+5 WKS Previous Scans:0 GESTATIONAL SAC No IUP visualized,left adnexal mass 2.4 x 1.5 x 1.9 cm  CROWN RUMP LENGTH                                                            TECHNICIAN COMMENTS: [place] TA/TV: homogeneous anteverted uterus,no IUP visualized,EEC 8.7 mm,normal right ovary,2.4 x 1.5 x 1.9 cm left adnexal mass adjacent to left ovary with color flow,small amount of cul de sac fluid,discussed results with Dr Rip Harbour A copy of this report including all images has been saved and backed up to a second source for retrieval if needed. All measures and details of the anatomical scan, placentation, fluid volume and pelvic anatomy are contained in that report. Amber Heide Guile 08/12/2022 12:29 PM Clinical Impression and recommendations: I have reviewed the sonogram results above, combined with the patient's current clinical course, below are my impressions and any appropriate recommendations for management based on the sonographic findings. Intrauterine pregnancy not seen.  Left adnexal mass 2.4 x 1.5 x 1.9 cm with flow.  Small amount of free fluid noted in cul-de-sac. Records reviewed, in  the setting of HCG level above 3000 on 08/08/2022, findings suggestive of ectopic  pregnancy.  Recommend clinical correlation regarding management options. Brooke Hunt   US OB Transvaginal  Result Date: 08/12/2022 Table formatting from the original result was not included.  ..an [employer] Diplomatic Services operational officer) accredited practice Center for [facility] @ [place] ,[place] [address] Ordering Provider: Janyth Pupa, DO                                                                                                                     DATING AND VIABILITY SONOGRAM Brooke Hunt is a 32 y.o. year old G3P1001  Patient's last menstrual period was 06/19/2022 (exact date). which would correlate to  7+[redacted] weeks gestation.  She has regular menstrual cycles.   She is here today for a confirmatory initial sonogram. GESTATION: No IUP    FETAL ACTIVITY:          Heart rate         N/A         CERVIX: Appears closed ADNEXA: Normal right ovary,2.4 x 1.5 x 1.9 cm left adnexal mass adjacent to left ovary with color flow GESTATIONAL AGE AND  BIOMETRICS: Gestational criteria: Estimated Date of Delivery: 03/26/2023 by LMP now at 7+5 WKS Previous Scans:0 GESTATIONAL SAC No IUP visualized,left adnexal mass 2.4 x 1.5 x 1.9 cm  CROWN RUMP LENGTH                                                            TECHNICIAN COMMENTS: [place] TA/TV: homogeneous anteverted uterus,no IUP visualized,EEC 8.7 mm,normal right ovary,2.4 x 1.5 x 1.9 cm left adnexal mass adjacent to left ovary with color flow,small amount of cul de sac fluid,discussed results with Dr Rip Harbour A copy of this report including all images has been saved and backed up to a second source for retrieval if needed. All measures and details of the anatomical scan, placentation, fluid volume and pelvic anatomy are contained in that report. Amber Heide Guile 08/12/2022 12:29 PM Clinical Impression and recommendations: I have reviewed the sonogram results above, combined with the patient's current clinical course, below are my  impressions and any appropriate recommendations for management based on the sonographic findings. Intrauterine pregnancy not seen.  Left adnexal mass 2.4 x 1.5 x 1.9 cm with flow.  Small amount of free fluid noted in cul-de-sac. Records reviewed, in the setting of HCG level above 3000 on 08/08/2022, findings suggestive of ectopic pregnancy.  Recommend clinical correlation regarding management options. Brooke Hunt     Assessment:   1. Pregnancy of unknown anatomic location   2. Early stage of pregnancy     -Consulted with Dr. Elgie Congo- MD recommends treatment with MTX. Patient hesitant given unknown pregnancy status.  MD ok with patient coming back for repeat HCG in 48 hours and if still abnormal, strongly recommends MTX at that time. Patient agreeable to plan of care  Plan: -Discharge home in stable condition -Strict ectopic precautions discussed -Patient advised to follow-up with MAU on 3/24 for repeat HCG -Patient may return to MAU as needed or if her condition were to change or worsen  Wende Mott, CNM 08/14/2022, 6:31 PM

## 2022-08-14 NOTE — Progress Notes (Signed)
RN called lab to inquire about CBC, hCG. Lab tech answered and informed RN that they have not received the sample.

## 2022-08-16 ENCOUNTER — Inpatient Hospital Stay (HOSPITAL_COMMUNITY)
Admission: AD | Admit: 2022-08-16 | Discharge: 2022-08-16 | Discharge status: Against Medical Advice | Payer: PRIVATE HEALTH INSURANCE | Attending: Obstetrics & Gynecology | Admitting: Obstetrics & Gynecology

## 2022-08-16 ENCOUNTER — Other Ambulatory Visit (HOSPITAL_COMMUNITY)
Admission: RE | Admit: 2022-08-16 | Discharge: 2022-08-16 | Discharge status: Home / Self Care | Payer: PRIVATE HEALTH INSURANCE | Source: Home / Self Care | Attending: Obstetrics & Gynecology

## 2022-08-16 ENCOUNTER — Other Ambulatory Visit: Payer: Self-pay

## 2022-08-16 DIAGNOSIS — Z5321 Procedure and treatment not carried out due to patient leaving prior to being seen by health care provider: Secondary | ICD-10-CM | POA: Diagnosis not present

## 2022-08-16 DIAGNOSIS — O009 Unspecified ectopic pregnancy without intrauterine pregnancy: Secondary | ICD-10-CM | POA: Diagnosis present

## 2022-08-16 LAB — COMPREHENSIVE METABOLIC PANEL
ALT: 18 U/L (ref 0–44)
AST: 15 U/L (ref 15–41)
Albumin: 3.6 g/dL (ref 3.5–5.0)
Alkaline Phosphatase: 63 U/L (ref 38–126)
Anion gap: 8 (ref 5–15)
BUN: 7 mg/dL (ref 6–20)
CO2: 26 mmol/L (ref 22–32)
Calcium: 8.8 mg/dL — ABNORMAL LOW (ref 8.9–10.3)
Chloride: 105 mmol/L (ref 98–111)
Creatinine, Ser: 0.64 mg/dL (ref 0.44–1.00)
GFR, Estimated: >60 mL/min (ref >60)
Glucose, Bld: 89 mg/dL (ref 70–99)
Potassium: 4.2 mmol/L (ref 3.5–5.1)
Sodium: 139 mmol/L (ref 135–145)
Total Bilirubin: 0.3 mg/dL (ref 0.3–1.2)
Total Protein: 6.6 g/dL (ref 6.5–8.1)

## 2022-08-16 LAB — CBC
HCT: 35.3 % — ABNORMAL LOW (ref 36.0–46.0)
Hemoglobin: 11.7 g/dL — ABNORMAL LOW (ref 12.0–15.0)
MCH: 27.2 pg (ref 26.0–34.0)
MCHC: 33.1 g/dL (ref 30.0–36.0)
MCV: 82.1 fL (ref 80.0–100.0)
Platelets: 335 10*3/uL (ref 150–400)
RBC: 4.3 MIL/uL (ref 3.87–5.11)
RDW: 14.8 % (ref 11.5–15.5)
WBC: 8.2 10*3/uL (ref 4.0–10.5)
nRBC: 0 % (ref 0.0–0.2)

## 2022-08-16 LAB — HCG, QUANTITATIVE, PREGNANCY: hCG, Beta Chain, Quant, S: 6466 m[IU]/mL — ABNORMAL HIGH (ref <5)

## 2022-08-16 MED ORDER — METHOTREXATE FOR ECTOPIC PREGNANCY
50.0000 mg/m2 | Freq: Once | INTRAMUSCULAR | Status: DC
  Filled 2022-08-16: qty 4

## 2022-08-16 NOTE — Progress Notes (Signed)
Pt called again, still not in lobby.

## 2022-08-16 NOTE — Progress Notes (Signed)
Pt called a 3rd time, no sitting in lobby

## 2022-08-16 NOTE — Progress Notes (Signed)
Pt called so POC could be discussed, no longer in lobby.

## 2022-08-16 NOTE — MAU Note (Signed)
Brooke Hunt is a 32 y.o. at Unknown here in MAU reporting: she's here for repeat HCG level. LMP: NA Onset of complaint: today Pain score: 0 Vitals:   08/16/22 1320  BP: (!) 109/56  Pulse: 92  Resp: 18  Temp: 98.3 F (36.8 C)  SpO2: 100%     FHT:NA Lab orders placed from triage:  HCG Level

## 2022-08-20 ENCOUNTER — Other Ambulatory Visit: Payer: PRIVATE HEALTH INSURANCE

## 2022-08-20 ENCOUNTER — Other Ambulatory Visit: Payer: Self-pay | Admitting: Obstetrics & Gynecology

## 2022-08-20 DIAGNOSIS — O3680X0 Pregnancy with inconclusive fetal viability, not applicable or unspecified: Secondary | ICD-10-CM

## 2022-08-24 ENCOUNTER — Encounter (HOSPITAL_COMMUNITY): Payer: Self-pay | Admitting: Family Medicine

## 2022-08-24 ENCOUNTER — Inpatient Hospital Stay (HOSPITAL_COMMUNITY)
Admission: AD | Admit: 2022-08-24 | Discharge: 2022-08-24 | Disposition: A | Discharge status: Home / Self Care | Payer: PRIVATE HEALTH INSURANCE | Attending: Family Medicine | Admitting: Family Medicine

## 2022-08-24 ENCOUNTER — Other Ambulatory Visit: Payer: Self-pay | Admitting: Adult Health

## 2022-08-24 DIAGNOSIS — Z3A09 9 weeks gestation of pregnancy: Secondary | ICD-10-CM | POA: Diagnosis not present

## 2022-08-24 DIAGNOSIS — O00102 Left tubal pregnancy without intrauterine pregnancy: Secondary | ICD-10-CM | POA: Insufficient documentation

## 2022-08-24 LAB — HCG, QUANTITATIVE, PREGNANCY: hCG, Beta Chain, Quant, S: 4560 m[IU]/mL — ABNORMAL HIGH (ref <5)

## 2022-08-24 NOTE — MAU Note (Signed)
Brooke Hunt is a 32 y.o. at [redacted]w[redacted]d here in MAU reporting: not having any pain, bleeding or discomfort.  Knows she is pregnant.  They could not see any pregnancy or mass on [place]. Was to have followed up with blood work  on the 3/24, then 7-10days after that was to get another [place].  Found out preg, because of spotting, thought irreg cycle.  LMP: 1/26, thought she had a short period mid Feb Onset of complaint: f/u  Pain score: none Vitals:   08/24/22 1517  BP: 124/77  Pulse: 75  Resp: 18  Temp: 97.9 F (36.6 C)  SpO2: 100%      Lab orders placed from triage:    Blood work has been drawn

## 2022-08-24 NOTE — MAU Provider Note (Signed)
Chief Complaint: Follow-up   Event Date/Time   First Provider Initiated Contact with Patient 08/24/22 1638      SUBJECTIVE HPI: Brooke Hunt is a 32 y.o. [id number] with diagnosed left ectopic pregnancy  with hcg of 6,561 on 08/16/22 and hcg of 6,466 on 08/16/22 who presents to maternity admissions reporting no pain or bleeding but she had to leave during her follow up on 3/24 and has not had any labwork since.  Methotrexate was recommended on 3/24 and ordered, but pt had to leave MAU prior to administration. She desires another pregnancy and wants to know if her lab is continuing to drop.   HPI  Past Medical History:  Diagnosis Date   Anemia    Contraceptive management 07/27/2014   Medical history non-contributory    Pregnant 04/03/2015   Past Surgical History:  Procedure Laterality Date   NO PAST SURGERIES     Social History   Socioeconomic History   Marital status: Single    Spouse name: Not on file   Number of children: Not on file   Years of education: Not on file   Highest education level: Not on file  Occupational History   Not on file  Tobacco Use   Smoking status: Some Days    Packs/day: 0.25    Years: 13.00    Additional pack years: 0.00    Total pack years: 3.25    Types: Cigarettes    Passive exposure: Never   Smokeless tobacco: Never  Vaping Use   Vaping Use: Never used  Substance and Sexual Activity   Alcohol use: Not Currently    Comment: occ; not now   Drug use: Yes    Frequency: 2.0 times per week    Types: Marijuana    Comment: cutting back   Sexual activity: Yes    Birth control/protection: None  Other Topics Concern   Not on file  Social History Narrative   Not on file   Social Determinants of Health   Financial Resource Strain: Not on file  Food Insecurity: Not on file  Transportation Needs: Not on file  Physical Activity: Not on file  Stress: Not on file  Social Connections: Not on file  Intimate Partner Violence: Not on file   No  current facility-administered medications on file prior to encounter.   Current Outpatient Medications on File Prior to Encounter  Medication Sig Dispense Refill   Prenatal Vit-Fe Fumarate-FA (PRENATAL VITAMIN PO) Take by mouth.     acetaminophen (TYLENOL) 325 MG tablet Take by mouth as needed. (Patient not taking: Reported on 08/12/2022)     Allergies  Allergen Reactions   Shellfish-Derived Products Swelling    Swells nasal passages    ROS:  Review of Systems  Constitutional:  Negative for chills, fatigue and fever.  Respiratory:  Negative for shortness of breath.   Cardiovascular:  Negative for chest pain.  Gastrointestinal:  Negative for nausea and vomiting.  Genitourinary:  Negative for difficulty urinating, dysuria, flank pain, pelvic pain, vaginal bleeding, vaginal discharge and vaginal pain.  Neurological:  Negative for dizziness and headaches.  Psychiatric/Behavioral: Negative.       I have reviewed patient's Past Medical Hx, Surgical Hx, Family Hx, Social Hx, medications and allergies.   Physical Exam  Patient Vitals for the past 24 hrs:  BP Temp Temp src Pulse Resp SpO2 Height Weight  08/24/22 1517 124/77 97.9 F (36.6 C) Oral 75 18 100 % 5\' 3"  (1.6 m) 88.8 kg  Constitutional: Well-developed, well-nourished female in no acute distress.  Cardiovascular: normal rate Respiratory: normal effort GI: Abd soft, non-tender. Pos BS x 4 MS: Extremities nontender, no edema, normal ROM Neurologic: Alert and oriented x 4.  GU: Neg CVAT.  PELVIC EXAM: Deferred  LAB RESULTS Results for orders placed or performed during the hospital encounter of 08/24/22 (from the past 24 hour(s))  hCG, quantitative, pregnancy     Status: Abnormal   Collection Time: 08/24/22  2:52 PM  Result Value Ref Range   hCG, Beta Chain, Quant, S 4,560 (H) <5 mIU/mL       IMAGING US OB Transvaginal  Result Date: 08/14/2022 CLINICAL DATA:  vaginal bleeding pregnancy. Left adnexal mass on previous  ultrasound EXAM: TRANSVAGINAL OB ULTRASOUND TECHNIQUE: Transvaginal ultrasound was performed for complete evaluation of the gestation as well as the maternal uterus, adnexal regions, and pelvic cul-de-sac. COMPARISON:  08/12/2022 FINDINGS: Intrauterine gestational sac: None Maternal uterus/adnexae: Anteverted uterus. No intrauterine gestational sac is identified. Bilateral ovaries appear within normal limits. The previously seen left adnexal mass is unable to be identified on the current exam. No adnexal masses are seen. Trace free fluid within the cul-de-sac. IMPRESSION: 1. Previously seen left adnexal mass is not identified on the current exam. 2. No intrauterine pregnancy or adnexal mass identified. Findings are consistent with pregnancy of unknown location and may reflect early intrauterine pregnancy not yet visualized sonographically, occult ectopic pregnancy, or failed pregnancy. Recommend trending of beta HCG as well as follow-up ultrasound in 7-10 days based on clinical course. Electronically Signed   By: Davina Poke D.O.   On: 08/14/2022 19:46   US OB Comp Less 14 Wks  Result Date: 08/12/2022 Table formatting from the original result was not included.  ..an [employer] Diplomatic Services operational officer) accredited practice Center for [facility] @ [place] [address] Ordering Provider: Janyth Pupa, DO                                                                                                                     DATING AND VIABILITY SONOGRAM Brooke Hunt is a 32 y.o. year old G3P1001  Patient's last menstrual period was 06/19/2022 (exact date). which would correlate to  7+[redacted] weeks gestation.  She has regular menstrual cycles.   She is here today for a confirmatory initial sonogram. GESTATION: No IUP    FETAL ACTIVITY:          Heart rate         N/A         CERVIX: Appears closed ADNEXA: Normal right ovary,2.4 x 1.5 x 1.9 cm left adnexal mass  adjacent to left ovary with color flow GESTATIONAL AGE AND  BIOMETRICS: Gestational criteria: Estimated Date of Delivery: 03/26/2023 by LMP now at 7+5 WKS Previous Scans:0 GESTATIONAL SAC No IUP visualized,left adnexal mass 2.4 x 1.5 x 1.9 cm  CROWN RUMP LENGTH  TECHNICIAN COMMENTS: [place] TA/TV: homogeneous anteverted uterus,no IUP visualized,EEC 8.7 mm,normal right ovary,2.4 x 1.5 x 1.9 cm left adnexal mass adjacent to left ovary with color flow,small amount of cul de sac fluid,discussed results with Dr Rip Harbour A copy of this report including all images has been saved and backed up to a second source for retrieval if needed. All measures and details of the anatomical scan, placentation, fluid volume and pelvic anatomy are contained in that report. Amber Heide Guile 08/12/2022 12:29 PM Clinical Impression and recommendations: I have reviewed the sonogram results above, combined with the patient's current clinical course, below are my impressions and any appropriate recommendations for management based on the sonographic findings. Intrauterine pregnancy not seen.  Left adnexal mass 2.4 x 1.5 x 1.9 cm with flow.  Small amount of free fluid noted in cul-de-sac. Records reviewed, in the setting of HCG level above 3000 on 08/08/2022, findings suggestive of ectopic pregnancy.  Recommend clinical correlation regarding management options. Annalee Genta   US OB Transvaginal  Result Date: 08/12/2022 Table formatting from the original result was not included.  ..an [employer] Diplomatic Services operational officer) accredited practice Center for [facility] @ [place] [address] Ordering Provider: Janyth Pupa, DO                                                                                                                     DATING AND VIABILITY SONOGRAM LATIVIA WENDORF is a 32 y.o. year old G3P1001  Patient's last menstrual period  was 06/19/2022 (exact date). which would correlate to  7+[redacted] weeks gestation.  She has regular menstrual cycles.   She is here today for a confirmatory initial sonogram. GESTATION: No IUP    FETAL ACTIVITY:          Heart rate         N/A         CERVIX: Appears closed ADNEXA: Normal right ovary,2.4 x 1.5 x 1.9 cm left adnexal mass adjacent to left ovary with color flow GESTATIONAL AGE AND  BIOMETRICS: Gestational criteria: Estimated Date of Delivery: 03/26/2023 by LMP now at 7+5 WKS Previous Scans:0 GESTATIONAL SAC No IUP visualized,left adnexal mass 2.4 x 1.5 x 1.9 cm  CROWN RUMP LENGTH                                                            TECHNICIAN COMMENTS: [place] TA/TV: homogeneous anteverted uterus,no IUP visualized,EEC 8.7 mm,normal right ovary,2.4 x 1.5 x 1.9 cm left adnexal mass adjacent to left ovary with color flow,small amount of cul de sac fluid,discussed results with Dr Rip Harbour A copy of this report including all images has been saved and backed up to a second source for retrieval if needed. All measures and details  of the anatomical scan, placentation, fluid volume and pelvic anatomy are contained in that report. Amber Heide Guile 08/12/2022 12:29 PM Clinical Impression and recommendations: I have reviewed the sonogram results above, combined with the patient's current clinical course, below are my impressions and any appropriate recommendations for management based on the sonographic findings. Intrauterine pregnancy not seen.  Left adnexal mass 2.4 x 1.5 x 1.9 cm with flow.  Small amount of free fluid noted in cul-de-sac. Records reviewed, in the setting of HCG level above 3000 on 08/08/2022, findings suggestive of ectopic pregnancy.  Recommend clinical correlation regarding management options. Clearnce Sorrel Ozan    MAU Management/MDM: Orders Placed This Encounter  Procedures   hCG, quantitative, pregnancy   Discharge patient    No orders of the defined types were placed in this encounter.   Hcg with  more significant drop today to 4,560.  Pt without any symptoms today in MAU.  Consult Dr Elly Modena with assessment and findings.  Plan for close outpatient follow up with stat hcg in 48 hours.  Pt to f/u at [facility] on Wednesday, 08/26/22 for stat hcg.  Ectopic precautions/reasons to return to MAU given.   ASSESSMENT 1. Left tubal pregnancy without intrauterine pregnancy     PLAN Discharge home Allergies as of 08/24/2022       Reactions   Shellfish-derived Products Swelling   Swells nasal passages        Medication List     TAKE these medications    acetaminophen 325 MG tablet Commonly known as: TYLENOL Take by mouth as needed.   PRENATAL VITAMIN PO Take by mouth.        Follow-up [place] for [facility] at [facility] Follow up.   Specialty: Obstetrics and Gynecology Why: The office will call you with appointment for labs on Wednesday 08/26/22. Contact information: [place] [facility] Follow up.   Specialty: Obstetrics and Gynecology Why: As needed for emergencies Contact information: [address] [id number] [place] Certified Nurse-Midwife 08/24/2022  5:13 PM

## 2022-08-27 ENCOUNTER — Other Ambulatory Visit: Payer: PRIVATE HEALTH INSURANCE

## 2022-08-27 DIAGNOSIS — O3680X0 Pregnancy with inconclusive fetal viability, not applicable or unspecified: Secondary | ICD-10-CM

## 2022-08-28 ENCOUNTER — Other Ambulatory Visit: Payer: Self-pay | Admitting: Adult Health

## 2022-08-28 DIAGNOSIS — O3680X0 Pregnancy with inconclusive fetal viability, not applicable or unspecified: Secondary | ICD-10-CM

## 2022-08-28 LAB — BETA HCG QUANT (REF LAB): hCG Quant: 2639 m[IU]/mL

## 2022-09-02 ENCOUNTER — Ambulatory Visit: Payer: PRIVATE HEALTH INSURANCE

## 2022-09-03 ENCOUNTER — Other Ambulatory Visit: Payer: Self-pay | Admitting: *Deleted

## 2022-09-03 DIAGNOSIS — Z3490 Encounter for supervision of normal pregnancy, unspecified, unspecified trimester: Secondary | ICD-10-CM

## 2022-09-04 ENCOUNTER — Other Ambulatory Visit: Payer: Self-pay | Admitting: Obstetrics & Gynecology

## 2022-09-04 DIAGNOSIS — O00102 Left tubal pregnancy without intrauterine pregnancy: Secondary | ICD-10-CM

## 2022-09-04 LAB — BETA HCG QUANT (REF LAB): hCG Quant: 1181 m[IU]/mL

## 2022-09-04 NOTE — Progress Notes (Signed)
Weekly hCG check due to ectopic pregnancy

## 2022-09-09 ENCOUNTER — Telehealth: Payer: Self-pay | Admitting: *Deleted

## 2022-09-09 NOTE — Telephone Encounter (Signed)
Left message @ 8:41 am asking pt to call regarding quant results or look at [facility] message. JSY

## 2022-09-10 NOTE — Telephone Encounter (Signed)
Left message @ 10:42 am to return call or look at [facility] message. JSY

## 2022-09-11 NOTE — Telephone Encounter (Signed)
Left message @ 1:26 pm for pt to return the call or read mychart message. JSY

## 2022-09-14 ENCOUNTER — Encounter: Payer: Self-pay | Admitting: *Deleted

## 2022-09-14 ENCOUNTER — Encounter: Payer: Self-pay | Admitting: Adult Health

## 2022-09-14 NOTE — Telephone Encounter (Signed)
Call can't be completed @ 12:14 pm. Will send certified letter. JSY

## 2022-09-18 ENCOUNTER — Encounter: Payer: Self-pay | Admitting: *Deleted

## 2022-09-21 NOTE — Telephone Encounter (Signed)
No answer @ 2:17 pm. JSY

## 2022-09-22 NOTE — Telephone Encounter (Signed)
Pt saw Mychart message on 09/17/22. Pt hasn't repeated lab. Multiple attempts to reach pt by phone, mychart messages and certified letter. Closing chart. JSY

## 2022-09-25 ENCOUNTER — Encounter (HOSPITAL_COMMUNITY): Payer: Self-pay

## 2022-09-25 ENCOUNTER — Inpatient Hospital Stay (HOSPITAL_COMMUNITY): Payer: PRIVATE HEALTH INSURANCE | Admitting: Certified Registered Nurse Anesthetist

## 2022-09-25 ENCOUNTER — Encounter (HOSPITAL_COMMUNITY)
Admission: EM | Disposition: A | Discharge status: Home / Self Care | Payer: Self-pay | Source: Home / Self Care | Attending: Obstetrics & Gynecology

## 2022-09-25 ENCOUNTER — Observation Stay (HOSPITAL_COMMUNITY)
Admission: AD | Admit: 2022-09-25 | Payer: PRIVATE HEALTH INSURANCE | Source: Home / Self Care | Admitting: Obstetrics & Gynecology

## 2022-09-25 ENCOUNTER — Inpatient Hospital Stay (HOSPITAL_BASED_OUTPATIENT_CLINIC_OR_DEPARTMENT_OTHER): Payer: PRIVATE HEALTH INSURANCE | Admitting: Certified Registered Nurse Anesthetist

## 2022-09-25 ENCOUNTER — Emergency Department (HOSPITAL_COMMUNITY): Payer: PRIVATE HEALTH INSURANCE

## 2022-09-25 ENCOUNTER — Other Ambulatory Visit: Payer: Self-pay

## 2022-09-25 ENCOUNTER — Observation Stay (HOSPITAL_COMMUNITY)
Admission: EM | Admit: 2022-09-25 | Discharge: 2022-09-26 | Disposition: A | Discharge status: Home / Self Care | Payer: PRIVATE HEALTH INSURANCE | Attending: Obstetrics & Gynecology | Admitting: Obstetrics & Gynecology

## 2022-09-25 DIAGNOSIS — Z3A00 Weeks of gestation of pregnancy not specified: Secondary | ICD-10-CM

## 2022-09-25 DIAGNOSIS — O00102 Left tubal pregnancy without intrauterine pregnancy: Secondary | ICD-10-CM

## 2022-09-25 DIAGNOSIS — O00112 Left tubal pregnancy with intrauterine pregnancy: Secondary | ICD-10-CM | POA: Diagnosis not present

## 2022-09-25 DIAGNOSIS — K661 Hemoperitoneum: Principal | ICD-10-CM | POA: Insufficient documentation

## 2022-09-25 DIAGNOSIS — R1030 Lower abdominal pain, unspecified: Secondary | ICD-10-CM | POA: Diagnosis present

## 2022-09-25 DIAGNOSIS — Z3A01 Less than 8 weeks gestation of pregnancy: Secondary | ICD-10-CM

## 2022-09-25 HISTORY — DX: Chlamydial infection, unspecified: A74.9

## 2022-09-25 HISTORY — PX: DIAGNOSTIC LAPAROSCOPY WITH REMOVAL OF ECTOPIC PREGNANCY: SHX6449

## 2022-09-25 LAB — COMPREHENSIVE METABOLIC PANEL
ALT: 29 U/L (ref 0–44)
AST: 18 U/L (ref 15–41)
Albumin: 4 g/dL (ref 3.5–5.0)
Alkaline Phosphatase: 76 U/L (ref 38–126)
Anion gap: 8 (ref 5–15)
BUN: 10 mg/dL (ref 6–20)
CO2: 26 mmol/L (ref 22–32)
Calcium: 8.8 mg/dL — ABNORMAL LOW (ref 8.9–10.3)
Chloride: 103 mmol/L (ref 98–111)
Creatinine, Ser: 0.73 mg/dL (ref 0.44–1.00)
GFR, Estimated: >60 mL/min (ref >60)
Glucose, Bld: 136 mg/dL — ABNORMAL HIGH (ref 70–99)
Potassium: 3.9 mmol/L (ref 3.5–5.1)
Sodium: 137 mmol/L (ref 135–145)
Total Bilirubin: 0.5 mg/dL (ref 0.3–1.2)
Total Protein: 7.3 g/dL (ref 6.5–8.1)

## 2022-09-25 LAB — TYPE AND SCREEN: Unit division: 0

## 2022-09-25 LAB — BPAM RBC
Blood Product Expiration Date: 202406042359
Blood Product Expiration Date: 202406042359

## 2022-09-25 LAB — CBC
HCT: 29.5 % — ABNORMAL LOW (ref 36.0–46.0)
Hemoglobin: 9.5 g/dL — ABNORMAL LOW (ref 12.0–15.0)
MCH: 27.5 pg (ref 26.0–34.0)
MCHC: 32.2 g/dL (ref 30.0–36.0)
MCV: 85.5 fL (ref 80.0–100.0)
Platelets: 339 10*3/uL (ref 150–400)
RBC: 3.45 MIL/uL — ABNORMAL LOW (ref 3.87–5.11)
RDW: 15.4 % (ref 11.5–15.5)
WBC: 22.8 10*3/uL — ABNORMAL HIGH (ref 4.0–10.5)
nRBC: 0 % (ref 0.0–0.2)

## 2022-09-25 LAB — CBC WITH DIFFERENTIAL/PLATELET
Abs Immature Granulocytes: 0.06 10*3/uL (ref 0.00–0.07)
Basophils Absolute: 0.1 10*3/uL (ref 0.0–0.1)
Basophils Relative: 1 %
Eosinophils Absolute: 0.1 10*3/uL (ref 0.0–0.5)
Eosinophils Relative: 1 %
HCT: 37.4 % (ref 36.0–46.0)
Hemoglobin: 12 g/dL (ref 12.0–15.0)
Immature Granulocytes: 1 %
Lymphocytes Relative: 13 %
Lymphs Abs: 1.6 10*3/uL (ref 0.7–4.0)
MCH: 27.3 pg (ref 26.0–34.0)
MCHC: 32.1 g/dL (ref 30.0–36.0)
MCV: 85 fL (ref 80.0–100.0)
Monocytes Absolute: 0.8 10*3/uL (ref 0.1–1.0)
Monocytes Relative: 6 %
Neutro Abs: 9.7 10*3/uL — ABNORMAL HIGH (ref 1.7–7.7)
Neutrophils Relative %: 78 %
Platelets: 372 10*3/uL (ref 150–400)
RBC: 4.4 MIL/uL (ref 3.87–5.11)
RDW: 15.4 % (ref 11.5–15.5)
WBC: 12.3 10*3/uL — ABNORMAL HIGH (ref 4.0–10.5)
nRBC: 0 % (ref 0.0–0.2)

## 2022-09-25 LAB — HCG, QUANTITATIVE, PREGNANCY: hCG, Beta Chain, Quant, S: 30 m[IU]/mL — ABNORMAL HIGH (ref <5)

## 2022-09-25 LAB — LIPASE, BLOOD: Lipase: 21 U/L (ref 11–51)

## 2022-09-25 LAB — PREPARE RBC (CROSSMATCH)

## 2022-09-25 SURGERY — LAPAROSCOPY, WITH ECTOPIC PREGNANCY SURGICAL TREATMENT
Anesthesia: General

## 2022-09-25 MED ORDER — ORAL CARE MOUTH RINSE: 15.0000 mL | Freq: Once | OROMUCOSAL | Status: AC

## 2022-09-25 MED ORDER — HYDROMORPHONE HCL 1 MG/ML IJ SOLN: 0.2000 mg | INTRAMUSCULAR | Status: DC | PRN

## 2022-09-25 MED ORDER — VASOPRESSIN 20 UNIT/ML IV SOLN
INTRAVENOUS | Status: DC | PRN
  Administered 2022-09-25: 2 [IU] via INTRAVENOUS
  Administered 2022-09-25: 1 [IU] via INTRAVENOUS
  Administered 2022-09-25: 2 [IU] via INTRAVENOUS

## 2022-09-25 MED ORDER — HYDROMORPHONE HCL 1 MG/ML IJ SOLN
1.0000 mg | Freq: Once | INTRAMUSCULAR | Status: AC
  Administered 2022-09-25: 1 mg via INTRAVENOUS
  Filled 2022-09-25: qty 1

## 2022-09-25 MED ORDER — MIDAZOLAM HCL 2 MG/2ML IJ SOLN
INTRAMUSCULAR | Status: AC
  Filled 2022-09-25: qty 2

## 2022-09-25 MED ORDER — FENTANYL CITRATE (PF) 100 MCG/2ML IJ SOLN: 25.0000 ug | INTRAMUSCULAR | Status: DC | PRN

## 2022-09-25 MED ORDER — FENTANYL CITRATE (PF) 250 MCG/5ML IJ SOLN
INTRAMUSCULAR | Status: AC
  Filled 2022-09-25: qty 5

## 2022-09-25 MED ORDER — PROPOFOL 10 MG/ML IV BOLUS
INTRAVENOUS | Status: DC | PRN
  Administered 2022-09-25: 60 mg via INTRAVENOUS
  Administered 2022-09-25: 200 mg via INTRAVENOUS

## 2022-09-25 MED ORDER — SIMETHICONE 80 MG PO CHEW: 80.0000 mg | CHEWABLE_TABLET | Freq: Four times a day (QID) | ORAL | Status: DC | PRN

## 2022-09-25 MED ORDER — BUPIVACAINE HCL (PF) 0.5 % IJ SOLN
INTRAMUSCULAR | Status: AC
  Filled 2022-09-25: qty 30

## 2022-09-25 MED ORDER — LACTATED RINGERS IV SOLN: INTRAVENOUS | Status: DC

## 2022-09-25 MED ORDER — BUPIVACAINE HCL (PF) 0.5 % IJ SOLN
INTRAMUSCULAR | Status: DC | PRN
  Administered 2022-09-25: 13 mL

## 2022-09-25 MED ORDER — AMISULPRIDE (ANTIEMETIC) 5 MG/2ML IV SOLN: 10.0000 mg | Freq: Once | INTRAVENOUS | Status: DC | PRN

## 2022-09-25 MED ORDER — MENTHOL 3 MG MT LOZG: 1.0000 | LOZENGE | OROMUCOSAL | Status: DC | PRN

## 2022-09-25 MED ORDER — PROPOFOL 10 MG/ML IV BOLUS
INTRAVENOUS | Status: AC
  Filled 2022-09-25: qty 20

## 2022-09-25 MED ORDER — OXYCODONE HCL 5 MG/5ML PO SOLN: 5.0000 mg | Freq: Once | ORAL | Status: DC | PRN

## 2022-09-25 MED ORDER — ALUM & MAG HYDROXIDE-SIMETH 200-200-20 MG/5ML PO SUSP: 30.0000 mL | ORAL | Status: DC | PRN

## 2022-09-25 MED ORDER — LIDOCAINE 2% (20 MG/ML) 5 ML SYRINGE
INTRAMUSCULAR | Status: DC | PRN
  Administered 2022-09-25: 60 mg via INTRAVENOUS

## 2022-09-25 MED ORDER — ZOLPIDEM TARTRATE 5 MG PO TABS: 5.0000 mg | ORAL_TABLET | Freq: Every evening | ORAL | Status: DC | PRN

## 2022-09-25 MED ORDER — SODIUM CHLORIDE 0.9 % IR SOLN
Status: DC | PRN
  Administered 2022-09-25: 1000 mL

## 2022-09-25 MED ORDER — BISACODYL 10 MG RE SUPP: 10.0000 mg | Freq: Every day | RECTAL | Status: DC | PRN

## 2022-09-25 MED ORDER — ACETAMINOPHEN 10 MG/ML IV SOLN: 1000.0000 mg | Freq: Once | INTRAVENOUS | Status: DC | PRN

## 2022-09-25 MED ORDER — GABAPENTIN 100 MG PO CAPS
100.0000 mg | ORAL_CAPSULE | Freq: Two times a day (BID) | ORAL | Status: DC
  Administered 2022-09-25 – 2022-09-26 (×2): 100 mg via ORAL
  Filled 2022-09-25 (×2): qty 1

## 2022-09-25 MED ORDER — GABAPENTIN 300 MG PO CAPS
300.0000 mg | ORAL_CAPSULE | ORAL | Status: AC
  Administered 2022-09-25: 300 mg via ORAL
  Filled 2022-09-25 (×2): qty 1

## 2022-09-25 MED ORDER — MAGNESIUM CITRATE PO SOLN: 1.0000 | Freq: Once | ORAL | Status: DC | PRN

## 2022-09-25 MED ORDER — MORPHINE SULFATE (PF) 4 MG/ML IV SOLN
4.0000 mg | Freq: Once | INTRAVENOUS | Status: AC
  Administered 2022-09-25: 4 mg via INTRAVENOUS
  Filled 2022-09-25: qty 1

## 2022-09-25 MED ORDER — ONDANSETRON HCL 4 MG/2ML IJ SOLN: 4.0000 mg | Freq: Four times a day (QID) | INTRAMUSCULAR | Status: DC | PRN

## 2022-09-25 MED ORDER — LACTATED RINGERS IV BOLUS
1000.0000 mL | Freq: Once | INTRAVENOUS | Status: AC
  Administered 2022-09-25: 1000 mL via INTRAVENOUS

## 2022-09-25 MED ORDER — CHLORHEXIDINE GLUCONATE 0.12 % MT SOLN
15.0000 mL | Freq: Once | OROMUCOSAL | Status: AC
  Administered 2022-09-25: 15 mL via OROMUCOSAL
  Filled 2022-09-25: qty 15

## 2022-09-25 MED ORDER — SENNOSIDES-DOCUSATE SODIUM 8.6-50 MG PO TABS: 1.0000 | ORAL_TABLET | Freq: Every evening | ORAL | Status: DC | PRN

## 2022-09-25 MED ORDER — IBUPROFEN 600 MG PO TABS: 600.0000 mg | ORAL_TABLET | Freq: Four times a day (QID) | ORAL | Status: DC

## 2022-09-25 MED ORDER — ACETAMINOPHEN 325 MG PO TABS: 325.0000 mg | ORAL_TABLET | ORAL | Status: DC | PRN

## 2022-09-25 MED ORDER — PROMETHAZINE HCL 25 MG/ML IJ SOLN: 6.2500 mg | INTRAMUSCULAR | Status: DC | PRN

## 2022-09-25 MED ORDER — KETOROLAC TROMETHAMINE 30 MG/ML IJ SOLN
30.0000 mg | Freq: Four times a day (QID) | INTRAMUSCULAR | Status: DC
  Administered 2022-09-26 (×3): 30 mg via INTRAVENOUS
  Filled 2022-09-25 (×3): qty 1

## 2022-09-25 MED ORDER — SODIUM CHLORIDE 0.9 % IV SOLN: INTRAVENOUS | Status: DC | PRN

## 2022-09-25 MED ORDER — ONDANSETRON HCL 4 MG PO TABS: 4.0000 mg | ORAL_TABLET | Freq: Four times a day (QID) | ORAL | Status: DC | PRN

## 2022-09-25 MED ORDER — IOHEXOL 300 MG/ML  SOLN
100.0000 mL | Freq: Once | INTRAMUSCULAR | Status: AC | PRN
  Administered 2022-09-25: 100 mL via INTRAVENOUS

## 2022-09-25 MED ORDER — SUGAMMADEX SODIUM 200 MG/2ML IV SOLN
INTRAVENOUS | Status: DC | PRN
  Administered 2022-09-25: 200 mg via INTRAVENOUS

## 2022-09-25 MED ORDER — VASOPRESSIN 20 UNIT/ML IV SOLN
INTRAVENOUS | Status: AC
  Filled 2022-09-25: qty 1

## 2022-09-25 MED ORDER — FENTANYL CITRATE (PF) 250 MCG/5ML IJ SOLN
INTRAMUSCULAR | Status: DC | PRN
  Administered 2022-09-25: 50 ug via INTRAVENOUS
  Administered 2022-09-25: 100 ug via INTRAVENOUS
  Administered 2022-09-25: 50 ug via INTRAVENOUS

## 2022-09-25 MED ORDER — ROCURONIUM BROMIDE 10 MG/ML (PF) SYRINGE
PREFILLED_SYRINGE | INTRAVENOUS | Status: DC | PRN
  Administered 2022-09-25: 50 mg via INTRAVENOUS
  Administered 2022-09-25: 10 mg via INTRAVENOUS

## 2022-09-25 MED ORDER — DEXAMETHASONE SODIUM PHOSPHATE 10 MG/ML IJ SOLN
INTRAMUSCULAR | Status: DC | PRN
  Administered 2022-09-25: 10 mg via INTRAVENOUS

## 2022-09-25 MED ORDER — OXYCODONE HCL 5 MG PO TABS
5.0000 mg | ORAL_TABLET | ORAL | Status: DC | PRN
  Administered 2022-09-26 (×2): 10 mg via ORAL
  Filled 2022-09-25 (×2): qty 2

## 2022-09-25 MED ORDER — PANTOPRAZOLE SODIUM 40 MG PO TBEC
40.0000 mg | DELAYED_RELEASE_TABLET | Freq: Every day | ORAL | Status: DC
  Administered 2022-09-26: 40 mg via ORAL
  Filled 2022-09-25: qty 1

## 2022-09-25 MED ORDER — PHENYLEPHRINE HCL-NACL 20-0.9 MG/250ML-% IV SOLN
INTRAVENOUS | Status: DC | PRN
  Administered 2022-09-25: 50 ug/min via INTRAVENOUS

## 2022-09-25 MED ORDER — OXYCODONE HCL 5 MG PO TABS: 5.0000 mg | ORAL_TABLET | Freq: Once | ORAL | Status: DC | PRN

## 2022-09-25 MED ORDER — ACETAMINOPHEN 160 MG/5ML PO SOLN: 325.0000 mg | ORAL | Status: DC | PRN

## 2022-09-25 MED ORDER — CEFAZOLIN SODIUM-DEXTROSE 2-4 GM/100ML-% IV SOLN
2.0000 g | INTRAVENOUS | Status: AC
  Administered 2022-09-25: 2 g via INTRAVENOUS
  Filled 2022-09-25: qty 100

## 2022-09-25 MED ORDER — ALBUMIN HUMAN 5 % IV SOLN: INTRAVENOUS | Status: DC | PRN

## 2022-09-25 MED ORDER — MIDAZOLAM HCL 2 MG/2ML IJ SOLN
INTRAMUSCULAR | Status: DC | PRN
  Administered 2022-09-25: 2 mg via INTRAVENOUS

## 2022-09-25 MED ORDER — ONDANSETRON HCL 4 MG/2ML IJ SOLN
INTRAMUSCULAR | Status: DC | PRN
  Administered 2022-09-25: 4 mg via INTRAVENOUS

## 2022-09-25 MED ORDER — ACETAMINOPHEN 500 MG PO TABS
1000.0000 mg | ORAL_TABLET | Freq: Four times a day (QID) | ORAL | Status: DC
  Administered 2022-09-26 (×3): 1000 mg via ORAL
  Filled 2022-09-25 (×3): qty 2

## 2022-09-25 MED ORDER — SUCCINYLCHOLINE CHLORIDE 200 MG/10ML IV SOSY
PREFILLED_SYRINGE | INTRAVENOUS | Status: DC | PRN
  Administered 2022-09-25: 120 mg via INTRAVENOUS

## 2022-09-25 MED ORDER — KETOROLAC TROMETHAMINE 30 MG/ML IJ SOLN: 30.0000 mg | Freq: Once | INTRAMUSCULAR | Status: DC

## 2022-09-25 MED ORDER — ACETAMINOPHEN 500 MG PO TABS
1000.0000 mg | ORAL_TABLET | ORAL | Status: AC
  Administered 2022-09-25: 1000 mg via ORAL
  Filled 2022-09-25: qty 2

## 2022-09-25 MED ORDER — PHENYLEPHRINE 80 MCG/ML (10ML) SYRINGE FOR IV PUSH (FOR BLOOD PRESSURE SUPPORT)
PREFILLED_SYRINGE | INTRAVENOUS | Status: DC | PRN
  Administered 2022-09-25 (×2): 240 ug via INTRAVENOUS
  Administered 2022-09-25: 160 ug via INTRAVENOUS
  Administered 2022-09-25: 320 ug via INTRAVENOUS

## 2022-09-25 MED ORDER — DOCUSATE SODIUM 100 MG PO CAPS
100.0000 mg | ORAL_CAPSULE | Freq: Two times a day (BID) | ORAL | Status: DC
  Administered 2022-09-25 – 2022-09-26 (×2): 100 mg via ORAL
  Filled 2022-09-25 (×2): qty 1

## 2022-09-25 SURGICAL SUPPLY — 33 items
APPLICATOR CHLORAPREP 10.5 ORG (MISCELLANEOUS) IMPLANT
CABLE HIGH FREQUENCY MONO STRZ (ELECTRODE) IMPLANT
CATH ROBINSON RED A/P 16FR (CATHETERS) ×1 IMPLANT
DERMABOND ADVANCED .7 DNX12 (GAUZE/BANDAGES/DRESSINGS) ×1 IMPLANT
DRSG OPSITE POSTOP 4X6 (GAUZE/BANDAGES/DRESSINGS) IMPLANT
DURAPREP 26ML APPLICATOR (WOUND CARE) ×1 IMPLANT
GAUZE 4X4 16PLY ~~LOC~~+RFID DBL (SPONGE) IMPLANT
GLOVE BIO SURGEON STRL SZ 6.5 (GLOVE) ×1 IMPLANT
GLOVE BIOGEL PI IND STRL 7.0 (GLOVE) ×3 IMPLANT
GLOVE ECLIPSE 7.0 STRL STRAW (GLOVE) ×1 IMPLANT
GOWN STRL REUS W/ TWL LRG LVL3 (GOWN DISPOSABLE) ×2 IMPLANT
GOWN STRL REUS W/TWL LRG LVL3 (GOWN DISPOSABLE) ×2
HIBICLENS CHG 4% 4OZ BTL (MISCELLANEOUS) IMPLANT
IRRIG SUCT STRYKERFLOW 2 WTIP (MISCELLANEOUS) ×1
IRRIGATION SUCT STRKRFLW 2 WTP (MISCELLANEOUS) IMPLANT
KIT PINK PAD W/HEAD ARE REST (MISCELLANEOUS) ×1 IMPLANT
KIT PINK PAD W/HEAD ARM REST (MISCELLANEOUS) ×1 IMPLANT
KIT TURNOVER KIT B (KITS) ×1 IMPLANT
NS IRRIG 1000ML POUR BTL (IV SOLUTION) ×1 IMPLANT
PACK LAPAROSCOPY BASIN (CUSTOM PROCEDURE TRAY) ×1 IMPLANT
PROTECTOR NERVE ULNAR (MISCELLANEOUS) ×2 IMPLANT
SET TUBE SMOKE EVAC HIGH FLOW (TUBING) ×1 IMPLANT
SHEARS HARMONIC ACE PLUS 36CM (ENDOMECHANICALS) IMPLANT
SLEEVE Z-THREAD 5X100MM (TROCAR) ×1 IMPLANT
SUT MNCRL AB 4-0 PS2 18 (SUTURE) ×1 IMPLANT
SUT VICRYL 0 UR6 27IN ABS (SUTURE) IMPLANT
SYS BAG RETRIEVAL 10MM (BASKET) ×1
SYSTEM BAG RETRIEVAL 10MM (BASKET) IMPLANT
TOWEL GREEN STERILE FF (TOWEL DISPOSABLE) ×2 IMPLANT
TRAY FOLEY W/BAG SLVR 14FR (SET/KITS/TRAYS/PACK) IMPLANT
TROCAR 11X100 Z THREAD (TROCAR) IMPLANT
TROCAR XCEL NON-BLD 5MMX100MML (ENDOMECHANICALS) ×1 IMPLANT
WARMER LAPAROSCOPE (MISCELLANEOUS) IMPLANT

## 2022-09-25 NOTE — Op Note (Signed)
Brooke Hunt PROCEDURE DATE: 09/25/2022  PREOPERATIVE DIAGNOSIS: Ruptured ectopic pregnancy POSTOPERATIVE DIAGNOSIS: Ruptured left fallopian tube ectopic pregnancy PROCEDURE: Laparoscopic left salpingectomy and removal of ectopic pregnancy SURGEON:  Dr. Jaynie Collins ANESTHESIOLOGY TEAM: Anesthesiologist: Stoltzfus, Nelle Don, DO CRNA: Lelon Perla, CRNA  INDICATIONS: 32 y.o. [phone] with ruptured left fallopian tube ectopic pregnancy here with the preoperative diagnoses as listed above.  Please refer to preoperative notes for more details. Patient was counseled regarding need for laparoscopic salpingectomy. Risks of surgery including bleeding which may require transfusion or reoperation, infection, injury to bowel or other surrounding organs, need for additional procedures including laparotomy and other postoperative/anesthesia complications were explained to patient.  Written informed consent was obtained.  FINDINGS:  Large amount of hemoperitoneum estimated to be about 1500 ml of blood and clots.  Dilated left fallopian tube containing ectopic gestation. Small normal appearing uterus, normal right fallopian tube, left ovary and right ovary.  ANESTHESIA: General INTRAVENOUS FLUIDS: 1700 ml, 250 ml albumin, 2 pRBCs ESTIMATED BLOOD LOSS: 20 ml URINE OUTPUT: 450 ml SPECIMENS: Left fallopian tube containing ectopic gestation COMPLICATIONS: None immediate  PROCEDURE IN DETAIL:  The patient was taken to the operating room where general anesthesia was administered and was found to be adequate.  She was placed in the dorsal lithotomy position, and was prepped and draped in a sterile manner.  A Foley catheter was inserted into her bladder and attached to constant drainage and a uterine manipulator was then advanced into the uterus .    After an adequate timeout was performed, attention was turned to the abdomen where an umbilical incision was made with the scalpel.  The Optiview 11-mm trocar and  sleeve were then advanced without difficulty with the laparoscope under direct visualization into the abdomen.  The abdomen was then insufflated with carbon dioxide gas and adequate pneumoperitoneum was obtained.  A survey of the patient's pelvis and abdomen revealed the findings above.  Two 5-mm  left lower quadrant ports were then placed under direct visualization.  The Nezhat suction irrigator was then used to suction the hemoperitoneum and irrigate the pelvis.  Attention was then turned to the left fallopian tube which was grasped and ligated from the underlying mesosalpinx and uterine attachment using the Harmonic instrument.  Good hemostasis was noted.  The specimen was placed in an EndoCatch bag and removed from the abdomen intact.  The abdomen was desufflated, and all instruments were removed.  The fascial incision of the 11-mm site was reapproximated with a 0 Vicryl figure-of-eight stitch; and all skin incisions were closed with 4-0 Monocryl and Dermabond. The patient tolerated the procedure well.  Sponge, lap, and needle counts were correct times three.  The patient was then taken to the recovery room awake, extubated and in stable condition.   Given the amount of blood loss, patient will be observed overnight in the hospital.  Will discharge in the morning if she remains stable.  Will recheck hemoglobin later tonight and ascertain need for possible transfusion.   Jaynie Collins, MD, FACOG Obstetrician & Gynecologist, [facility] for [employer], [facility]

## 2022-09-25 NOTE — H&P (Signed)
Preoperative History and Physical  Brooke Hunt is a 32 y.o. [id number] here for surgical management of ruptured ectopic pregnancy causing hemoperitoneum. Presented to [facility] ER with sudden onset of severe lower abdominal pain.  She had been diagnosed with left tubal ectopic pregnancy since 08/12/22, declined methotrexate treatment but her HCG showed great decline over time, HCG is actually 30 today down from a peak of 6561.  No vaginal bleeding. At [facility] ER, vitals were stable,  she had significant lower abdominal pain with some guarding.  Hgb was 12.0, and imaging showed findings suspicious of left adnexa ruptured ectopic pregnancy (3.5 cm x 3.1 cm in size) with large hemoperitoneum and active extravasation.  I was called and informed of this patient, and it was recommended that she should come to [facility] for surgical management.  On arrival to [facility], patient seemed like she was syncopal while still in the ER gurney.  She was able to talk after a few seconds, did not seem to be confused, and verified her location and reason for transfer here. She was transferred to [facility] Short Stay in order to prepare for emergent surgery.  She still reports lower abdominal pain. No syncope or vaginal bleeding.  No significant preoperative concerns.  Proposed surgery: Laparoscopic removal of fallopian tube and ectopic pregnancy.  Past Medical History:  Diagnosis Date   Anemia    Chlamydia    Past Surgical History:  Procedure Laterality Date   NO PAST SURGERIES     OB History  Gravida Para Term Preterm AB Living  3 2 2     2   SAB IAB Ectopic Multiple Live Births        0 2    # Outcome Date GA Lbr Len/2nd Weight Sex Delivery Anes PTL Lv  3 Gravida           2 Term 06/15/15 [redacted]w[redacted]d   F Vag-Spont  N LIV  1 Term 06/17/14 [redacted]w[redacted]d 15:35 / 00:12 3075 g M Vag-Spont None N LIV     Birth Comments: Neg RPR, HIV, Hep B + UDS for THC with hx of maternal use throughout pregnancy + maternal chlamydia in pregnancy but neg  PTD  HB FAS  Patient denies any other pertinent gynecologic issues.   No current facility-administered medications on file prior to encounter.   Current Outpatient Medications on File Prior to Encounter  Medication Sig Dispense Refill   acetaminophen (TYLENOL) 325 MG tablet Take by mouth as needed. (Patient not taking: Reported on 08/12/2022)     Prenatal Vit-Fe Fumarate-FA (PRENATAL VITAMIN PO) Take by mouth.     Allergies  Allergen Reactions   Shellfish-Derived Products Swelling    Swells nasal passages    Social History:   reports that she has been smoking cigarettes. She has a 6.50 pack-year smoking history. She has never been exposed to tobacco smoke. She has never used smokeless tobacco. She reports that she does not currently use alcohol. She reports current drug use. Frequency: 2.00 times per week. Drug: Marijuana.  Family History  Problem Relation Age of Onset   Healthy Mother    Healthy Father    Sickle cell trait Son    Arthritis Maternal Grandmother    Dementia Maternal Grandmother    Diabetes Paternal Grandmother     Review of Systems: Pertinent items noted in HPI and remainder of comprehensive ROS otherwise negative.  PHYSICAL EXAM: Blood pressure 95/69, pulse 97, temperature 98.5 F (36.9 C), temperature source Oral, resp.  rate 20, height 5\' 3"  (1.6 m), weight 88.8 kg, last menstrual period 09/07/2022, SpO2 97 %, unknown if currently breastfeeding. CONSTITUTIONAL: Well-developed, well-nourished female in some distress due to pain.  NECK: Normal range of motion, supple, no masses SKIN: Skin is warm and dry. Appears pale. No rash noted. Not diaphoretic. No erythema. No pallor. NEUROLOGIC: Alert and oriented to person, place, and time. Normal reflexes, muscle tone coordination. No cranial nerve deficit noted. PSYCHIATRIC: Normal mood and affect. Normal behavior. Normal judgment and thought content. CARDIOVASCULAR: Normal heart rate noted, regular  rhythm RESPIRATORY: Effort and breath sounds normal, no problems with respiration noted ABDOMEN: Soft, nondistended, diffuse lower abdominal tenderness with guarding PELVIC: Deferred MUSCULOSKELETAL: Normal range of motion. No edema and no tenderness. 2+ distal pulses.  Labs: Results for orders placed or performed during the hospital encounter of 09/25/22 (from the past 336 hour(s))  CBC with Differential   Collection Time: 09/25/22  1:47 PM  Result Value Ref Range   WBC 12.3 (H) 4.0 - 10.5 K/uL   RBC 4.40 3.87 - 5.11 MIL/uL   Hemoglobin 12.0 12.0 - 15.0 g/dL   HCT 01.0 27.2 - 53.6 %   MCV 85.0 80.0 - 100.0 fL   MCH 27.3 26.0 - 34.0 pg   MCHC 32.1 30.0 - 36.0 g/dL   RDW 64.4 03.4 - 74.2 %   Platelets 372 150 - 400 K/uL   nRBC 0.0 0.0 - 0.2 %   Neutrophils Relative % 78 %   Neutro Abs 9.7 (H) 1.7 - 7.7 K/uL   Lymphocytes Relative 13 %   Lymphs Abs 1.6 0.7 - 4.0 K/uL   Monocytes Relative 6 %   Monocytes Absolute 0.8 0.1 - 1.0 K/uL   Eosinophils Relative 1 %   Eosinophils Absolute 0.1 0.0 - 0.5 K/uL   Basophils Relative 1 %   Basophils Absolute 0.1 0.0 - 0.1 K/uL   Immature Granulocytes 1 %   Abs Immature Granulocytes 0.06 0.00 - 0.07 K/uL  Comprehensive metabolic panel   Collection Time: 09/25/22  1:47 PM  Result Value Ref Range   Sodium 137 135 - 145 mmol/L   Potassium 3.9 3.5 - 5.1 mmol/L   Chloride 103 98 - 111 mmol/L   CO2 26 22 - 32 mmol/L   Glucose, Bld 136 (H) 70 - 99 mg/dL   BUN 10 6 - 20 mg/dL   Creatinine, Ser 5.95 0.44 - 1.00 mg/dL   Calcium 8.8 (L) 8.9 - 10.3 mg/dL   Total Protein 7.3 6.5 - 8.1 g/dL   Albumin 4.0 3.5 - 5.0 g/dL   AST 18 15 - 41 U/L   ALT 29 0 - 44 U/L   Alkaline Phosphatase 76 38 - 126 U/L   Total Bilirubin 0.5 0.3 - 1.2 mg/dL   GFR, Estimated >63 >87 mL/min   Anion gap 8 5 - 15  Lipase, blood   Collection Time: 09/25/22  1:47 PM  Result Value Ref Range   Lipase 21 11 - 51 U/L  hCG, quantitative, pregnancy   Collection Time: 09/25/22   1:47 PM  Result Value Ref Range   hCG, Beta Chain, Quant, S 30 (H) <5 mIU/mL    Imaging Studies: CT ABDOMEN PELVIS W CONTRAST  Result Date: 09/25/2022 CLINICAL DATA:  Acute onset of left lower quadrant abdominal pain. Last bowel movement 4 days ago. Recent miscarriage versus tubal ectopic pregnancy per discussion with Melton Alar. EXAM: CT ABDOMEN AND PELVIS WITH CONTRAST TECHNIQUE: Multidetector CT imaging of the  abdomen and pelvis was performed using the standard protocol following bolus administration of intravenous contrast. RADIATION DOSE REDUCTION: This exam was performed according to the departmental dose-optimization program which includes automated exposure control, adjustment of the mA and/or kV according to patient size and/or use of iterative reconstruction technique. CONTRAST:  OMNIPAQUE IOHEXOL 300 MG/ML  SOLN COMPARISON:  Obstetric ultrasound 09/25/2022 and 08/14/2022. No previous CT. FINDINGS: Lower chest: Clear lung bases. No significant pleural or pericardial effusion. Hepatobiliary: The liver is normal in density without suspicious focal abnormality. No evidence of gallstones, gallbladder wall thickening or biliary dilatation. Pancreas: Unremarkable. No pancreatic ductal dilatation or surrounding inflammatory changes. Spleen: Normal in size without focal abnormality. Adrenals/Urinary Tract: Both adrenal glands appear normal. No evidence of urinary tract calculus, suspicious renal lesion or hydronephrosis. The bladder appears unremarkable for its degree of distention. Stomach/Bowel: No enteric contrast administered. The stomach appears unremarkable for its degree of distension. No evidence of bowel wall thickening, distention or surrounding inflammatory change. The appendix appears normal, best seen on the coronal images. There is only mildly prominent stool throughout the colon. Vascular/Lymphatic: There are no enlarged abdominal or pelvic lymph nodes. No significant atherosclerosis  or large vessel occlusion. There is evidence of active extravasation in the left adnexa, further described below. Reproductive: The uterus appears unremarkable. Heterogeneous left adnexal mass with high density components measuring up to 3.5 x 3.1 cm on image 70/2. Other: There is heterogeneous, intermediate density peritoneal fluid in the left mid abdomen, measuring up to 13.0 x 5.0 cm transverse on image 45/2, suspicious for hemoperitoneum. High density fluid extends into the pelvis. There are low-density components in the upper abdomen, surrounding the liver and spleen. As above, there is heterogeneous high density superior to the heterogeneous left adnexal lesion described above, suspicious for a ruptured ectopic pregnancy with active extravasation (best depicted on axial image 66/12 and coronal image 47/5). No pneumoperitoneum. Musculoskeletal: No acute or significant osseous findings. IMPRESSION: 1. Findings are highly suspicious for a ruptured ectopic pregnancy with active extravasation in the left adnexa and a large amount of hemoperitoneum. Emergent gynecology consultation recommended. Findings may be amenable to endovascular embolization. 2. No other acute findings or explanation for the patient's symptoms. 3. No evidence of bowel obstruction or perforation. The appendix appears normal. 4. Critical Value/emergent results were called by telephone at the time of interpretation on 09/25/2022 at 4:35 pm to provider [facility] , who verbally acknowledged these results. Electronically Signed   By: Carey Bullocks M.D.   On: 09/25/2022 16:45   US OB Comp < 14 Wks  Result Date: 09/25/2022 CLINICAL DATA:  Sudden onset lower abdominal pain. Recent tubal pregnancy EXAM: OBSTETRIC <14 WK ULTRASOUND TECHNIQUE: Transabdominal ultrasound was performed for evaluation of the gestation as well as the maternal uterus and adnexal regions. COMPARISON:  Ultrasound 08/14/2022 and older FINDINGS: Uterus: Uterus measures 8.5 x  3.5 by 3.4 cm. Endometrial stripe is poorly defined. Study is very limited due to overlapping bowel gas, soft tissue and a contracted urinary bladder. No endovaginal imaging. What is labeled as right ovary appears to have small follicles and measures 2.3 x 1.5 x 2.2 cm. What is labeled as left ovary measures 3.4 x 3.7 by 3.3 cm. There are no follicles in the structure to confirm an ovary. No separate adnexal mass. No free fluid IMPRESSION: Very limited study. Endometrium is poorly seen but no clear intrauterine pregnancy. Poor visualization of the ovaries. No separate adnexal mass or free fluid. Please correlate for  history of ectopic pregnancy and treatment. Additional workup as clinically directed Electronically Signed   By: Karen Kays M.D.   On: 09/25/2022 15:36    Assessment: Patient Active Problem List   Diagnosis Date Noted   Ruptured left tubal ectopic pregnancy causing hemoperitoneum 08/12/2022   Marijuana use 04/23/2015   History of chlamydia 04/17/2015   Smoker 04/17/2015    Plan: Patient will undergo surgical management with Laparoscopic removal of fallopian tube and ectopic pregnancy.   The risks of surgery were discussed in detail with the patient including but not limited to: bleeding which may require transfusion or reoperation; infection which may require antibiotics; injury to surrounding organs which may involve uterus, bowel, bladder, ureters; need for additional procedures including laparoscopy or subsequent procedures secondary to abnormal pathology; thromboembolic phenomenon, surgical site problems and other postoperative/anesthesia complications. Likelihood of success in alleviating the patient's condition was discussed. Routine postoperative instructions will be reviewed with the patient and her family in detail after surgery.  The patient concurred with the proposed plan, giving informed written consent for the surgery.  Patient has been NPO since last night and she will remain  NPO for procedure.  Anesthesia and OR aware.  Preoperative prophylactic antibiotics and SCDs ordered on call to the OR.  To OR when ready.  If patient is stable after surgery, she will be discharged to home from PACU. This was discussed with the patient.    Jaynie Collins, MD, FACOG Obstetrician & Gynecologist, [facility] for [employer], [facility]

## 2022-09-25 NOTE — ED Notes (Signed)
Patient transported to CT 

## 2022-09-25 NOTE — MAU Provider Note (Signed)
    Please refer to preoperative H & P for admission details.   Jaynie Collins, MD, FACOG Obstetrician & Gynecologist, [facility] for [employer], [facility]

## 2022-09-25 NOTE — Anesthesia Preprocedure Evaluation (Signed)
Anesthesia Evaluation  Patient identified by MRN, date of birth, ID band Patient awake    Reviewed: Allergy & Precautions, Patient's Chart, lab work & pertinent test results  Airway Mallampati: II  TM Distance: >3 FB Neck ROM: Full    Dental no notable dental hx.    Pulmonary Current Smoker and Patient abstained from smoking.   Pulmonary exam normal        Cardiovascular negative cardio ROS  Rhythm:Regular Rate:Normal     Neuro/Psych negative neurological ROS     GI/Hepatic   Endo/Other    Renal/GU      Musculoskeletal negative musculoskeletal ROS (+)    Abdominal Normal abdominal exam  (+)   Peds  Hematology negative hematology ROS (+)   Anesthesia Other Findings   Reproductive/Obstetrics                             Anesthesia Physical Anesthesia Plan  ASA: 2 and emergent  Anesthesia Plan: General   Post-op Pain Management: Ofirmev IV (intra-op)*   Induction: Intravenous, Rapid sequence and Cricoid pressure planned  PONV Risk Score and Plan: 3 and Ondansetron, Dexamethasone and Midazolam  Airway Management Planned: Oral ETT  Additional Equipment: None  Intra-op Plan:   Post-operative Plan: Extubation in OR  Informed Consent: I have reviewed the patients History and Physical, chart, labs and discussed the procedure including the risks, benefits and alternatives for the proposed anesthesia with the patient or authorized representative who has indicated his/her understanding and acceptance.     Dental advisory given  Plan Discussed with: CRNA  Anesthesia Plan Comments:        Anesthesia Quick Evaluation

## 2022-09-25 NOTE — Discharge Instructions (Signed)
Laparoscopic Surgery - Care After Laparoscopy is a surgical procedure. It is used to diagnose and treat diseases inside the belly(abdomen). It is usually a brief, common, and relatively simple procedure. The laparoscopeis a thin, lighted, pencil-sized instrument. It is like a telescope. It is inserted into your abdomen through a small cut (incision). Your caregiver can look at the organs inside your body through this instrument.  She can see if there is anything abnormal. Laparoscopy can be done either in a hospital or outpatient clinic. You may be given a mild sedative to help you relax before the procedure. Once in the operating room, you will be given a drug to make you sleep (general anesthesia). Laparoscopy usually lasts about 1 hour. After the procedure, you will be monitored in a recovery area until you are stable and doing well. Once you are home, it may take 3 to 7 days to fully recover.  Laparoscopy has relatively few risks. Your caregiver will discuss the risks with you before the procedure. Some problems that can occur include: RISKS AND COMPLICATIONS  Allergies to medicines. Difficulty breathing. Bleeding. Infection. Damage to other surrounding structures HOME CARE INSTRUCTIONS  Infection. Bleeding. Damage to other organs. Anesthetic side effects.  Need for additional procedures such as open procedures/laparotomy PROCEDURE Once you receive anesthesia, your surgeon inflates the abdomen with a harmless gas (carbon dioxide). This makes the organs easier to see. The laparoscope is inserted into the abdomen through a small incision. This allows your surgeon to see into the abdomen. Other small instruments are also inserted into the abdomen through other small openings. Many surgeons attach a video camera to the laparoscope to enlarge the view. During a laparoscopy, the surgeon may be looking for inflammation, infection, or cancer.  The surgeon may also need to take out certain organs or  take tissue samples (biopsies). The specimens are sent to a specialist in looking at cells and tissue samples (pathologist). The pathologist examines them under a microscope to help to diagnose or confirm a disease. AFTER THE PROCEDURE  The incisions are closed with stitches (sutures) and Dermabond. Because these incisions are small (usually less than 1/2 inch), there is usually minimal discomfort after the procedure. There may also be discomfort from the instrument placement incisions in the abdomen. You will be given pain medicine to ease any discomfort. You will rest in a recovery room for 1-2 hours until you are stable and doing well. You may have some mild discomfort in the throat. This is from the tube placed in your throat while you were sleeping. You may experience discomfort in the shoulder area from some trapped air between the liver and diaphragm. This sensation is normal and will slowly go away on its own. The recovery time is shortened as long as there are no complications. You will rest in a recovery room until stable and doing well. As long as there are no complications, you may be allowed to go home. Someone will need to drive you home and be with you for at least 24 hours once home. FINDING OUT THE RESULTS You will be called with the results of the pathology and will discuss these results with  your caregiver during your postoperative appointment. Do not assume everything is normal if you have not heard from your caregiver or the medical facility. It is important for you to follow up on all of your results. HOME CARE INSTRUCTIONS  Take all medicines as directed. Only take over-the-counter or prescription medicines for pain, discomfort,  or fever as directed by your caregiver. Resume daily activities as directed. Showers are preferred over baths. You may resume sexual activities in 1 week or as directed. Do not drive while taking narcotics. SEEK MEDICAL CARE IF:  There is increasing  abdominal pain. You feel lightheaded or faint. You have the chills. You have an oral temperature above 102 F (38.9 C). There is pus-like (purulent) drainage from any of the wounds. You are unable to pass gas or have a bowel movement. You feel sick to your stomach (nauseous) or throw up (vomit). MAKE SURE YOU:  Understand these instructions. Will watch your condition. Will get help right away if you are not doing well or get worse.  ExitCare Patient Information 2013 [place], [place].   HOME INSTRUCTIONS  Please note any unusual or excessive bleeding, pain, swelling. Mild dizziness or drowsiness are normal for about 24 hours after surgery.   Shower when comfortable  Restrictions: No driving for 24 hours or while taking pain medications.  Activity:  No heavy lifting (> 10 lbs), nothing in vagina (no tampons, douching, or intercourse) x 4 weeks; no tub baths for 4 weeks Vaginal spotting is expected but if your bleeding is heavy, period like,  please call the office   Diet:  You may return to your regular diet.  Do not eat large meals.  Eat small frequent meals throughout the day.  Continue to drink a good amount of water at least 6-8 glasses of water per day, hydration is very important for the healing process.  Pain Management: Take over the counter tylenol or ibuprofen as needed for pain.  You can either take one or alternate between the two medications for pain management.  For severe or breakthrough pain, a prescription of oxycodone has been sent in.  You can either take this medication along with the tylenol, if the tylenol is not "strong" enough to manage your pain.  This medication may cause constipation so be sure to continue with a stool softener like Colace while taking this medication.  You may also use a heating pack as needed.    Alcohol -- Avoid for 24 hours and while taking pain medications.  Nausea: Take sips of ginger ale or soda  Fever -- Call physician if temperature  over 101 degrees  Follow up:  If you do not already have a follow up appointment scheduled, please call the office at [phone].  If you experience fever (a temperature greater than 100.4), pain unrelieved by pain medication, shortness of breath, swelling of a single leg, or any other symptoms which are concerning to you please the office immediately.

## 2022-09-25 NOTE — Transfer of Care (Signed)
Immediate Anesthesia Transfer of Care Note  Patient: Brooke Hunt  Procedure(s) Performed: DIAGNOSTIC LAPAROSCOPY WITH REMOVAL OF ECTOPIC PREGNANCY and removal of fallopian tube  Patient Location: PACU  Anesthesia Type:General  Level of Consciousness: awake, alert , and oriented  Airway & Oxygen Therapy: Patient Spontanous Breathing  Post-op Assessment: Report given to RN and Post -op Vital signs reviewed and stable  Post vital signs: Reviewed and stable  Last Vitals:  Vitals Value Taken Time  BP 152/104 09/25/22 2105  Temp    Pulse 101 09/25/22 2108  Resp 16 09/25/22 2108  SpO2 97 % 09/25/22 2108  Vitals shown include unvalidated device data.  Last Pain:  Vitals:   09/25/22 1656  TempSrc:   PainSc: 9          Complications: No notable events documented.

## 2022-09-25 NOTE — Anesthesia Procedure Notes (Signed)
Procedure Name: Intubation Date/Time: 09/25/2022 7:05 PM  Performed by: Lelon Perla, CRNAPre-anesthesia Checklist: Patient identified, Emergency Drugs available, Suction available and Patient being monitored Patient Re-evaluated:Patient Re-evaluated prior to induction Oxygen Delivery Method: Circle system utilized Preoxygenation: Pre-oxygenation with 100% oxygen Induction Type: IV induction, Cricoid Pressure applied and Rapid sequence Laryngoscope Size: Mac and 3 Grade View: Grade II Tube type: Oral Tube size: 7.0 mm Number of attempts: 1 Airway Equipment and Method: Stylet and Oral airway Placement Confirmation: ETT inserted through vocal cords under direct vision, positive ETCO2 and breath sounds checked- equal and bilateral Secured at: 22 cm Tube secured with: Tape Dental Injury: Teeth and Oropharynx as per pre-operative assessment

## 2022-09-25 NOTE — ED Provider Notes (Signed)
[place] EMERGENCY DEPARTMENT AT [facility] Provider Note   CSN: [id number] Arrival date & time: 09/25/22  1314     History  Chief Complaint  Patient presents with   Abdominal Pain    Brooke Hunt is a 32 y.o. female with past medical history significant for anemia, marijuana use, recent left tubal pregnancy presents to the ED complaining of severe lower abdominal pain that was sudden onset earlier today.  Patient states she has not had a BM in 4 days and took laxatives prior to arrival.  She states she has lower abdominal cramping and feels "gassy" all over.  Patient had suspected ectopic pregnancy at the end of March, was diagnosed with left tubal pregnancy and had serial hCG and US performed.  Patient did not end up taking methotrexate.  Her hCG did downtrend to around 1,000.  She reports she has not had any vaginal bleeding, but is also unsure if she had a miscarriage due to no large passing of blood or clots.  Denies fever, melena, diarrhea, nausea, vomiting, dysuria, pelvic pain, vaginal bleeding, discharge or pain, syncope.        Home Medications Prior to Admission medications   Medication Sig Start Date End Date Taking? Authorizing Provider  acetaminophen (TYLENOL) 325 MG tablet Take by mouth as needed. Patient not taking: Reported on 08/12/2022    [provider]  Prenatal Vit-Fe Fumarate-FA (PRENATAL VITAMIN PO) Take by mouth.    [provider]      Allergies    Shellfish-derived products    Review of Systems   Review of Systems  Constitutional:  Negative for fever.  Gastrointestinal:  Positive for abdominal pain and constipation. Negative for blood in stool, diarrhea, nausea and vomiting.  Genitourinary:  Negative for dysuria, pelvic pain, vaginal bleeding, vaginal discharge and vaginal pain.  Neurological:  Negative for syncope.    Physical Exam Updated Vital Signs BP 128/86 (BP Location: Left Arm)   Pulse 98   Temp 98.5 F (36.9  C) (Oral)   Resp 18   Ht 5\' 3"  (1.6 m)   Wt 88.8 kg   LMP 09/07/2022 (Approximate)   SpO2 100%   Breastfeeding Unknown   BMI 34.68 kg/m  Physical Exam Vitals and nursing note reviewed.  Constitutional:      General: She is not in acute distress.    Appearance: Normal appearance. She is not ill-appearing or diaphoretic.  Cardiovascular:     Rate and Rhythm: Normal rate and regular rhythm.  Pulmonary:     Effort: Pulmonary effort is normal.  Abdominal:     General: Abdomen is flat. Bowel sounds are normal.     Palpations: Abdomen is soft.     Tenderness: There is abdominal tenderness in the right lower quadrant, suprapubic area and left lower quadrant. There is guarding.     Hernia: No hernia is present.  Neurological:     Mental Status: She is alert. Mental status is at baseline.  Psychiatric:        Mood and Affect: Mood normal.        Behavior: Behavior normal.     ED Results / Procedures / Treatments   Labs (all labs ordered are listed, but only abnormal results are displayed) Labs Reviewed  CBC WITH DIFFERENTIAL/PLATELET - Abnormal; Notable for the following components:      Result Value   WBC 12.3 (*)    Neutro Abs 9.7 (*)    All other components within normal  limits  COMPREHENSIVE METABOLIC PANEL - Abnormal; Notable for the following components:   Glucose, Bld 136 (*)    Calcium 8.8 (*)    All other components within normal limits  HCG, QUANTITATIVE, PREGNANCY - Abnormal; Notable for the following components:   hCG, Beta Chain, Quant, S 30 (*)    All other components within normal limits  LIPASE, BLOOD  URINALYSIS, ROUTINE W REFLEX MICROSCOPIC    EKG None  Radiology CT ABDOMEN PELVIS W CONTRAST  Result Date: 09/25/2022 CLINICAL DATA:  Acute onset of left lower quadrant abdominal pain. Last bowel movement 4 days ago. Recent miscarriage versus tubal ectopic pregnancy per discussion with Melton Alar. EXAM: CT ABDOMEN AND PELVIS WITH CONTRAST TECHNIQUE:  Multidetector CT imaging of the abdomen and pelvis was performed using the standard protocol following bolus administration of intravenous contrast. RADIATION DOSE REDUCTION: This exam was performed according to the departmental dose-optimization program which includes automated exposure control, adjustment of the mA and/or kV according to patient size and/or use of iterative reconstruction technique. CONTRAST:  OMNIPAQUE IOHEXOL 300 MG/ML  SOLN COMPARISON:  Obstetric ultrasound 09/25/2022 and 08/14/2022. No previous CT. FINDINGS: Lower chest: Clear lung bases. No significant pleural or pericardial effusion. Hepatobiliary: The liver is normal in density without suspicious focal abnormality. No evidence of gallstones, gallbladder wall thickening or biliary dilatation. Pancreas: Unremarkable. No pancreatic ductal dilatation or surrounding inflammatory changes. Spleen: Normal in size without focal abnormality. Adrenals/Urinary Tract: Both adrenal glands appear normal. No evidence of urinary tract calculus, suspicious renal lesion or hydronephrosis. The bladder appears unremarkable for its degree of distention. Stomach/Bowel: No enteric contrast administered. The stomach appears unremarkable for its degree of distension. No evidence of bowel wall thickening, distention or surrounding inflammatory change. The appendix appears normal, best seen on the coronal images. There is only mildly prominent stool throughout the colon. Vascular/Lymphatic: There are no enlarged abdominal or pelvic lymph nodes. No significant atherosclerosis or large vessel occlusion. There is evidence of active extravasation in the left adnexa, further described below. Reproductive: The uterus appears unremarkable. Heterogeneous left adnexal mass with high density components measuring up to 3.5 x 3.1 cm on image 70/2. Other: There is heterogeneous, intermediate density peritoneal fluid in the left mid abdomen, measuring up to 13.0 x 5.0 cm  transverse on image 45/2, suspicious for hemoperitoneum. High density fluid extends into the pelvis. There are low-density components in the upper abdomen, surrounding the liver and spleen. As above, there is heterogeneous high density superior to the heterogeneous left adnexal lesion described above, suspicious for a ruptured ectopic pregnancy with active extravasation (best depicted on axial image 66/12 and coronal image 47/5). No pneumoperitoneum. Musculoskeletal: No acute or significant osseous findings. IMPRESSION: 1. Findings are highly suspicious for a ruptured ectopic pregnancy with active extravasation in the left adnexa and a large amount of hemoperitoneum. Emergent gynecology consultation recommended. Findings may be amenable to endovascular embolization. 2. No other acute findings or explanation for the patient's symptoms. 3. No evidence of bowel obstruction or perforation. The appendix appears normal. 4. Critical Value/emergent results were called by telephone at the time of interpretation on 09/25/2022 at 4:35 pm to provider [facility] , who verbally acknowledged these results. Electronically Signed   By: Carey Bullocks M.D.   On: 09/25/2022 16:45   US OB Comp < 14 Wks  Result Date: 09/25/2022 CLINICAL DATA:  Sudden onset lower abdominal pain. Recent tubal pregnancy EXAM: OBSTETRIC <14 WK ULTRASOUND TECHNIQUE: Transabdominal ultrasound was performed for evaluation of the  gestation as well as the maternal uterus and adnexal regions. COMPARISON:  Ultrasound 08/14/2022 and older FINDINGS: Uterus: Uterus measures 8.5 x 3.5 by 3.4 cm. Endometrial stripe is poorly defined. Study is very limited due to overlapping bowel gas, soft tissue and a contracted urinary bladder. No endovaginal imaging. What is labeled as right ovary appears to have small follicles and measures 2.3 x 1.5 x 2.2 cm. What is labeled as left ovary measures 3.4 x 3.7 by 3.3 cm. There are no follicles in the structure to confirm an  ovary. No separate adnexal mass. No free fluid IMPRESSION: Very limited study. Endometrium is poorly seen but no clear intrauterine pregnancy. Poor visualization of the ovaries. No separate adnexal mass or free fluid. Please correlate for history of ectopic pregnancy and treatment. Additional workup as clinically directed Electronically Signed   By: Karen Kays M.D.   On: 09/25/2022 15:36    Procedures Procedures    Medications Ordered in ED Medications  lactated ringers bolus 1,000 mL (0 mLs Intravenous Stopped 09/25/22 1451)  morphine (PF) 4 MG/ML injection 4 mg (4 mg Intravenous Given 09/25/22 1352)  morphine (PF) 4 MG/ML injection 4 mg (4 mg Intravenous Given 09/25/22 1527)  iohexol (OMNIPAQUE) 300 MG/ML solution 100 mL (100 mLs Intravenous Contrast Given 09/25/22 1613)  lactated ringers bolus 1,000 mL (1,000 mLs Intravenous New Bag/Given 09/25/22 1657)  HYDROmorphone (DILAUDID) injection 1 mg (1 mg Intravenous Given 09/25/22 1657)    ED Course/ Medical Decision Making/ A&P                             Medical Decision Making Amount and/or Complexity of Data Reviewed Labs: ordered. Radiology: ordered.  Risk Prescription drug management.   This patient presents to the ED with chief complaint(s) of lower abdominal pain with pertinent past medical history of recent left tubal pregnancy.  The complaint involves an extensive differential diagnosis and also carries with it a high risk of complications and morbidity.    The differential diagnosis includes ruptured ectopic pregnancy, incomplete abortion, diverticulitis, UTI, infectious colitis, ovarian cyst, ovarian torsion, PID, appendicitis, bowel obstruction   The initial plan is to obtain baseline labs, hCG, and UA  Additional history obtained: Records reviewed  - patient seen multiple times by OBGYN.  She was initially diagnosed with a pregnancy of unknown location, which was then suspected to be a left tubal pregnancy.  Patient never  received methotrexate.  Serial hCGs continued to decline, however, patient did not continue to get these labs performed as ordered.  Patient did not have follow-up ultrasound performed.  Initial Assessment:   Exam significant for tenderness to palpation of the abdomen, specifically in the lower abdomen.  Patient has most tenderness in the LLQ with guarding.  Bowel sounds are normal.  No appreciable hernias or discoloration to the skin.  No rashes.  Patient is hemodynamically stable and afebrile.   Independent ECG/labs interpretation:  The following labs were independently interpreted:  hCG 30.  CBC with leukocytosis, no anemia.  Metabolic panel without major electrolyte disturbance.  Hepatic and renal function both normal.  Lipase normal.  UA ordered, patient has not yet produced urine.    Independent visualization and interpretation of imaging: I independently visualized the following imaging with scope of interpretation limited to determining acute life threatening conditions related to emergency care: ultrasound, which revealed no evidence of intrauterine pregnancy.  No adnexal masses.  No endovaginal images obtained.  Limited  study.  I agree with radiologist interpretation.  Due to severely limited study, ordered CT abdomen/pelvis which revealed findings suspicious of left adnexa ruptured ectopic pregnancy with large hemoperitoneum.  There is active extravasation.  Discussed results with radiologist Carey Bullocks.    Consultations obtained: I requested emergent consultation with on call OBGYN MD and spoke with Dr. Macon Large who agreed to accept patient and states she can be transferred to MAU for surgical treatment.   Treatment and Reassessment: Patient given IV fluids and morphine for pain.  Patient continues to have severe abdominal pain, will order Dilaudid and continue to give IV fluids.  Patient remains hemodynamically stable at this time.    Disposition:   Patient to be transferred to  MAU emergently for ruptured ectopic pregnancy with hemoperitoneum.           Final Clinical Impression(s) / ED Diagnoses Final diagnoses:  Ruptured left tubal ectopic pregnancy causing hemoperitoneum    Rx / DC Orders ED Discharge Orders     None         Lenard Simmer, PA-C 09/25/22 1739    Glendora Score, MD 09/25/22 1827

## 2022-09-25 NOTE — ED Triage Notes (Signed)
Pt reports she has not had a BM in 4 days, took a laxative 30 minutes prior to arrival and has lower abd cramping and feels "gassy".  Pt with hx of miscarriage in April and denies any vaginal bleeding, discharge, nausea or vomiting.

## 2022-09-26 ENCOUNTER — Encounter (HOSPITAL_COMMUNITY): Payer: Self-pay | Admitting: Obstetrics & Gynecology

## 2022-09-26 LAB — TYPE AND SCREEN
ABO/RH(D): O POS
Antibody Screen: NEGATIVE
Unit division: 0

## 2022-09-26 LAB — BPAM RBC
ISSUE DATE / TIME: 202405031905
ISSUE DATE / TIME: 202405031905
Unit Type and Rh: 5100
Unit Type and Rh: 5100

## 2022-09-26 LAB — CBC
HCT: 27.9 % — ABNORMAL LOW (ref 36.0–46.0)
Hemoglobin: 9.6 g/dL — ABNORMAL LOW (ref 12.0–15.0)
MCH: 28.4 pg (ref 26.0–34.0)
MCHC: 34.4 g/dL (ref 30.0–36.0)
MCV: 82.5 fL (ref 80.0–100.0)
Platelets: 203 10*3/uL (ref 150–400)
RBC: 3.38 MIL/uL — ABNORMAL LOW (ref 3.87–5.11)
RDW: 14.9 % (ref 11.5–15.5)
WBC: 24.5 10*3/uL — ABNORMAL HIGH (ref 4.0–10.5)
nRBC: 0 % (ref 0.0–0.2)

## 2022-09-26 MED ORDER — ONDANSETRON 4 MG PO TBDP: 4.0000 mg | ORAL_TABLET | Freq: Three times a day (TID) | ORAL | 0 refills | Status: DC | PRN

## 2022-09-26 MED ORDER — ACETAMINOPHEN 325 MG PO TABS: 650.0000 mg | ORAL_TABLET | Freq: Four times a day (QID) | ORAL | Status: DC | PRN

## 2022-09-26 MED ORDER — OXYCODONE HCL 5 MG PO TABS: 5.0000 mg | ORAL_TABLET | Freq: Four times a day (QID) | ORAL | 0 refills | Status: AC | PRN

## 2022-09-26 MED ORDER — PROPOFOL 10 MG/ML IV BOLUS
INTRAVENOUS | Status: AC
  Filled 2022-09-26: qty 20

## 2022-09-26 MED ORDER — FERROUS GLUCONATE 324 (38 FE) MG PO TABS: 324.0000 mg | ORAL_TABLET | ORAL | 0 refills | Status: DC

## 2022-09-26 MED ORDER — DOCUSATE SODIUM 100 MG PO CAPS: 100.0000 mg | ORAL_CAPSULE | Freq: Two times a day (BID) | ORAL | 0 refills | Status: AC

## 2022-09-26 MED ORDER — IBUPROFEN 600 MG PO TABS: 600.0000 mg | ORAL_TABLET | Freq: Four times a day (QID) | ORAL | 0 refills | Status: DC

## 2022-09-26 NOTE — Anesthesia Postprocedure Evaluation (Signed)
Anesthesia Post Note  Patient: Brooke Hunt  Procedure(s) Performed: Laparoscopic left salpingectomy and removal of ectopic pregnancy     Patient location during evaluation: PACU Anesthesia Type: General Level of consciousness: awake and alert Pain management: pain level controlled Vital Signs Assessment: post-procedure vital signs reviewed and stable Respiratory status: spontaneous breathing, nonlabored ventilation, respiratory function stable and patient connected to nasal cannula oxygen Cardiovascular status: blood pressure returned to baseline and stable Postop Assessment: no apparent nausea or vomiting Anesthetic complications: no   No notable events documented.  Last Vitals:  Vitals:   09/25/22 2247 09/25/22 2347  BP: (!) 149/78 139/80  Pulse: 85 79  Resp: 17 18  Temp: 36.8 C 36.8 C  SpO2: 98% 100%    Last Pain:  Vitals:   09/26/22 0235  TempSrc:   PainSc: 7                  Tredarius Cobern P Cydnee Fuquay

## 2022-09-26 NOTE — Discharge Summary (Signed)
Physician Discharge Summary  Patient ID: Brooke Hunt MRN: [id number] DOB/AGE: 23-Jan-1991 32 y.o.  Admit date: 09/25/2022 Discharge date: 09/26/2022  Admission Diagnoses:  Discharge Diagnoses:  Principal Problem:   Ruptured left tubal ectopic pregnancy causing hemoperitoneum   Discharged Condition: stable  Hospital Course: 40JW [id number] admitted for ruptured ectopic pregnancy.  Pt initially presented to [facility] ER due to abdominal pain and was transferred to our facility for management due to concern for ruptured ectopic.  Imaging showed findings suspicious of left adnexa ruptured ectopic pregnancy (3.5 cm x 3.1 cm in size) with large hemoperitoneum and active extravasation. Pt underwent Laparoscopic left salpingectomy and removal of ectopic pregnancy- see operative note for further information.  She received 2u pRBCs intra-operatively.  Postop Hgb stable at 9.5.  Pt is ambulating, tolerating po and voiding on her own.  She was discharged home in stable condition with plans for close outpatient follow up.  Consults: None  Significant Diagnostic Studies: labs:     Latest Ref Rng & Units 09/26/2022    5:08 AM 09/25/2022    6:25 PM 09/25/2022    1:47 PM  CBC  WBC 4.0 - 10.5 K/uL 24.5  22.8  12.3   Hemoglobin 12.0 - 15.0 g/dL 9.6  9.5  29.5   Hematocrit 36.0 - 46.0 % 27.9  29.5  37.4   Platelets 150 - 400 K/uL 203  339  372      Treatments: IV hydration, analgesia: Toradol, and surgery: Laparoscopic left salpingectomy and removal of ectopic pregnancy   Discharge Exam: Blood pressure 124/77, pulse 73, temperature 98.7 F (37.1 C), temperature source Oral, resp. rate 16, height 5\' 3"  (1.6 m), weight 88.8 kg, last menstrual period 09/07/2022, SpO2 96 %, unknown if currently breastfeeding. General appearance: alert, cooperative, and no distress Resp: clear to auscultation bilaterally Cardio: regular rate and rhythm GI: soft, appropriately tender, +BS.  Incision C/D/I with  honeycomb Extremities: no edema, no calf tenderness bilaterally Skin: warm and dry Neurologic: Grossly normal  Disposition: Discharge disposition: 01-Home or Self Care        Allergies as of 09/26/2022       Reactions   Shellfish-derived Products Swelling   Swells nasal passages        Medication List     TAKE these medications    acetaminophen 325 MG tablet Commonly known as: Tylenol Take 2 tablets (650 mg total) by mouth every 6 (six) hours as needed. What changed:  how much to take when to take this   docusate sodium 100 MG capsule Commonly known as: COLACE Take 1 capsule (100 mg total) by mouth 2 (two) times daily for 20 days.   ferrous gluconate 324 MG tablet Commonly known as: FERGON Take 1 tablet (324 mg total) by mouth every other day.   ibuprofen 600 MG tablet Commonly known as: ADVIL Take 1 tablet (600 mg total) by mouth every 6 (six) hours. Start taking on: Sep 27, 2022   ondansetron 4 MG disintegrating tablet Commonly known as: ZOFRAN-ODT Take 1 tablet (4 mg total) by mouth every 8 (eight) hours as needed for nausea or vomiting.   oxyCODONE 5 MG immediate release tablet Commonly known as: Oxy IR/ROXICODONE Take 1 tablet (5 mg total) by mouth every 6 (six) hours as needed for up to 7 days for moderate pain.   PRENATAL VITAMIN PO Take by mouth.        Follow-up Information     [facility] for [facility].   Specialty: Obstetrics and Gynecology Why: Please make a follow up appointment in 1-2wks Contact information: [address] [place] [address] [phone]                Signed: Sharon Seller 09/26/2022, 6:54 AM

## 2022-09-28 ENCOUNTER — Telehealth: Payer: Self-pay

## 2022-09-28 ENCOUNTER — Telehealth: Payer: PRIVATE HEALTH INSURANCE | Admitting: Physician Assistant

## 2022-09-28 DIAGNOSIS — G8918 Other acute postprocedural pain: Secondary | ICD-10-CM

## 2022-09-28 DIAGNOSIS — Z8759 Personal history of other complications of pregnancy, childbirth and the puerperium: Secondary | ICD-10-CM

## 2022-09-28 NOTE — Patient Instructions (Signed)
  Brooke Hunt, thank you for joining Margaretann Loveless, PA-C for today's virtual visit.  While this provider is not your primary care provider (PCP), if your PCP is located in our provider database this encounter information will be shared with them immediately following your visit.   A [place] MyChart account gives you access to today's visit and all your visits, tests, and labs performed at [facility] " click here if you don't have a [place] MyChart account or go to [email or web address]  Consent: (Patient) Brooke Hunt provided verbal consent for this virtual visit at the beginning of the encounter.  Current Medications:  Current Outpatient Medications:    acetaminophen (TYLENOL) 325 MG tablet, Take 2 tablets (650 mg total) by mouth every 6 (six) hours as needed., Disp: , Rfl:    docusate sodium (COLACE) 100 MG capsule, Take 1 capsule (100 mg total) by mouth 2 (two) times daily for 20 days., Disp: 40 capsule, Rfl: 0   ferrous gluconate (FERGON) 324 MG tablet, Take 1 tablet (324 mg total) by mouth every other day., Disp: 30 tablet, Rfl: 0   ibuprofen (ADVIL) 600 MG tablet, Take 1 tablet (600 mg total) by mouth every 6 (six) hours., Disp: 30 tablet, Rfl: 0   ondansetron (ZOFRAN-ODT) 4 MG disintegrating tablet, Take 1 tablet (4 mg total) by mouth every 8 (eight) hours as needed for nausea or vomiting., Disp: 20 tablet, Rfl: 0   oxyCODONE (OXY IR/ROXICODONE) 5 MG immediate release tablet, Take 1 tablet (5 mg total) by mouth every 6 (six) hours as needed for up to 7 days for moderate pain., Disp: 25 tablet, Rfl: 0   Prenatal Vit-Fe Fumarate-FA (PRENATAL VITAMIN PO), Take by mouth., Disp: , Rfl:    Medications ordered in this encounter:  No orders of the defined types were placed in this encounter.    *If you need refills on other medications prior to your next appointment, please contact your pharmacy*  Follow-Up: Call back or seek an in-person evaluation if the  symptoms worsen or if the condition fails to improve as anticipated.  [facility] [phone]   If you have been instructed to have an in-person evaluation today at a local Urgent Care facility, please use the link below. It will take you to a list of all of our available [facility], including address, phone number and hours of operation. Please do not delay care.  [facility]  If you or a family member do not have a primary care provider, use the link below to schedule a visit and establish care. When you choose a [facility] primary care physician or advanced practice provider, you gain a long-term partner in health. Find a Primary Care Provider  Learn more about [facility]'s in-office and virtual care options: [place] - Get Care Now

## 2022-09-28 NOTE — Progress Notes (Signed)
Patient had surgery for ruptured tubal pregnancy on 09/25/22. On Oxycodone 5mg , acetaminophen 650mg  and ibuprofen 600mg . Reports pain not controlled. Has contacted surgeons office yesterday (office closed) and today (left VM, they have not returned call). Advised to call Surgeon's office again. If no response still, seek immediate care at closest Urgent Care for better pain management.

## 2022-09-28 NOTE — Telephone Encounter (Signed)
Patient called and stated that she needs stronger pain medication.  What was prescribed is not working.

## 2022-09-28 NOTE — Telephone Encounter (Signed)
Patient stating that she was dx with ectopic pregnancy.  Patient states that she had surgery and the pain medication prescribed is not effective.  Message routed to [facility] clinical pool as she is a patient is seen at [facility].    Leonette Nutting  09/28/22

## 2022-09-29 ENCOUNTER — Other Ambulatory Visit: Payer: Self-pay | Admitting: Obstetrics & Gynecology

## 2022-09-29 DIAGNOSIS — G8918 Other acute postprocedural pain: Secondary | ICD-10-CM

## 2022-09-29 LAB — SURGICAL PATHOLOGY

## 2022-09-29 MED ORDER — GABAPENTIN 300 MG PO CAPS
300.0000 mg | ORAL_CAPSULE | Freq: Three times a day (TID) | ORAL | 0 refills | Status: DC
Start: 2022-09-29 — End: 2023-03-16

## 2022-09-29 NOTE — Telephone Encounter (Signed)
Duplicate message. Other message has been sent to Dr. Charlotta Newton.

## 2022-09-29 NOTE — Progress Notes (Signed)
Tried calling patient- no answer.  Rc'd note that pain medicine not strong enough.  Rx for gabapentin to take along with other meds.  Myna Hidalgo, DO Attending Obstetrician & Gynecologist, [facility] for [employer], [facility]

## 2022-10-07 ENCOUNTER — Encounter: Payer: PRIVATE HEALTH INSURANCE | Admitting: Obstetrics & Gynecology

## 2022-10-14 ENCOUNTER — Other Ambulatory Visit (HOSPITAL_COMMUNITY)
Admission: RE | Admit: 2022-10-14 | Discharge: 2022-10-14 | Disposition: A | Discharge status: Home / Self Care | Payer: PRIVATE HEALTH INSURANCE | Source: Ambulatory Visit | Attending: Obstetrics & Gynecology | Admitting: Obstetrics & Gynecology

## 2022-10-14 ENCOUNTER — Encounter: Payer: Self-pay | Admitting: Obstetrics & Gynecology

## 2022-10-14 ENCOUNTER — Ambulatory Visit (INDEPENDENT_AMBULATORY_CARE_PROVIDER_SITE_OTHER): Payer: PRIVATE HEALTH INSURANCE | Admitting: Obstetrics & Gynecology

## 2022-10-14 VITALS — BP 139/84 | HR 94 | Ht 63.0 in | Wt 197.0 lb

## 2022-10-14 DIAGNOSIS — Z124 Encounter for screening for malignant neoplasm of cervix: Secondary | ICD-10-CM | POA: Diagnosis present

## 2022-10-14 DIAGNOSIS — Z8759 Personal history of other complications of pregnancy, childbirth and the puerperium: Secondary | ICD-10-CM

## 2022-10-14 DIAGNOSIS — Z4889 Encounter for other specified surgical aftercare: Secondary | ICD-10-CM | POA: Diagnosis not present

## 2022-10-14 DIAGNOSIS — Z3009 Encounter for other general counseling and advice on contraception: Secondary | ICD-10-CM | POA: Diagnosis not present

## 2022-10-14 DIAGNOSIS — R4586 Emotional lability: Secondary | ICD-10-CM | POA: Diagnosis not present

## 2022-10-14 NOTE — Progress Notes (Signed)
    PostOp Visit Note  Brooke Hunt is a 32 y.o. [phone] female who presents for a postoperative visit. She is 2 weeks postop following a laparoscopic left salpingectomy with removal of ectopic completed on 09/25/2022   Upon entry into the room patient was crying.  She notes that she is upset sometimes and she does not know why.  She is concerned about postpartum depression.  Denies SI/HI.  Denies fever or chills.  Tolerating gen diet.  +Flatus, Regular BMs.  Pain is well controlled, just taking ibuprofen. Overall doing well and reports no acute complaints   Review of Systems Pertinent items are noted in HPI.    Objective:  BP 139/84 (BP Location: Left Arm, Patient Position: Sitting, Cuff Size: Normal)   Pulse 94   Ht 5\' 3"  (1.6 m)   Wt 197 lb (89.4 kg)   LMP 09/07/2022 (Approximate)   Breastfeeding Unknown   BMI 34.90 kg/m    Physical Examination:  GENERAL ASSESSMENT: tearful SKIN: normal color, no lesions CHEST: normal air exchange, respiratory effort normal with no retractions HEART: regular rate and rhythm ABDOMEN: soft, non-distended, +BS INCISION: well healed- C/D, appropriately tender EXTREMITY: no edema, no calf tenderness bilaterally PSYCH: mood appropriate, normal affect       Assessment:    -postop visit -Depression/mood changes -cervical cancer screen   Plan:   -meeting milestones appropriately -reviewed postop care -discussed future pregnancy/family planning.  Patient does desire a pregnancy in the near future.  Advised waiting at least 3 months from May both physical and emotional standpoint -Referral created to behavioral health -Pap collected reviewed ASCCP guidelines  Myna Hidalgo, DO Attending Obstetrician & Gynecologist, Faculty Practice Center for [facility], [facility]

## 2022-10-15 ENCOUNTER — Ambulatory Visit
Admission: EM | Admit: 2022-10-15 | Discharge: 2022-10-15 | Disposition: A | Discharge status: Home / Self Care | Payer: PRIVATE HEALTH INSURANCE | Attending: Nurse Practitioner | Admitting: Nurse Practitioner

## 2022-10-15 DIAGNOSIS — K0889 Other specified disorders of teeth and supporting structures: Secondary | ICD-10-CM

## 2022-10-15 MED ORDER — AMOXICILLIN-POT CLAVULANATE 875-125 MG PO TABS: 1.0000 | ORAL_TABLET | Freq: Two times a day (BID) | ORAL | 0 refills | Status: AC

## 2022-10-15 MED ORDER — IBUPROFEN 600 MG PO TABS: 600.0000 mg | ORAL_TABLET | Freq: Three times a day (TID) | ORAL | 0 refills | Status: DC | PRN

## 2022-10-15 NOTE — ED Triage Notes (Signed)
Pt reports she has a dental pain on the left side of her mouth that is making her ear hurt x 2 days.

## 2022-10-15 NOTE — Discharge Instructions (Addendum)
Take the Augmentin as prescribed to treat dental infection  Continue ibuprofen every 6 hours alternating with Tylenol [phone] every 6 hours as needed for pain  Follow up with Dentist - list is provided

## 2022-10-15 NOTE — ED Provider Notes (Signed)
[facility]    CSN: [id number] Arrival date & time: 10/15/22  0807      History   Chief Complaint No chief complaint on file.   HPI Brooke Hunt is a 32 y.o. female.   Patient presents today with 2-day history of left-sided upper and lower dental pain.  Reports she has a history of broken teeth and a couple of caps on her teeth.  Does not have a dentist that she follows with regularly.  She denies fever or nausea/vomiting.  No drainage inside of her mouth.  Reports initially, she was taking prescription strength ibuprofen which did help with the pain, however when she ran out the pain worsened significantly.  Reports she has not slept all night because of the pain.  The pain is shooting to bilateral ears.  No ear drainage.  Patient reports approximately 3 weeks ago, she had left fallopian tube removed secondary to ectopic pregnancy.  She has not had a menstrual cycle yet, however follows with OB/GYN regularly.  She is positive she is not pregnant.    Past Medical History:  Diagnosis Date   Anemia    Chlamydia     Patient Active Problem List   Diagnosis Date Noted   Ruptured left tubal ectopic pregnancy causing hemoperitoneum 08/12/2022   Marijuana use 04/23/2015   History of chlamydia 04/17/2015   Smoker 04/17/2015    Past Surgical History:  Procedure Laterality Date   DIAGNOSTIC LAPAROSCOPY WITH REMOVAL OF ECTOPIC PREGNANCY N/A 09/25/2022   Procedure: Laparoscopic left salpingectomy and removal of ectopic pregnancy;  Surgeon: Tereso Newcomer, MD;  Location: [facility] OR;  Service: Gynecology;  Laterality: N/A;   NO PAST SURGERIES      OB History     Gravida  3   Para  2   Term  2   Preterm      AB  1   Living  2      SAB      IAB      Ectopic  1   Multiple  0   Live Births  2            Home Medications    Prior to Admission medications   Medication Sig Start Date End Date Taking? Authorizing Provider  amoxicillin-clavulanate  (AUGMENTIN) 875-125 MG tablet Take 1 tablet by mouth 2 (two) times daily for 7 days. 10/15/22 10/22/22 Yes Valentino Nose, NP  acetaminophen (TYLENOL) 325 MG tablet Take 2 tablets (650 mg total) by mouth every 6 (six) hours as needed. Patient not taking: Reported on 10/14/2022 09/26/22   Myna Hidalgo, DO  docusate sodium (COLACE) 100 MG capsule Take 1 capsule (100 mg total) by mouth 2 (two) times daily for 20 days. Patient not taking: Reported on 10/14/2022 09/26/22 10/16/22  Myna Hidalgo, DO  ferrous gluconate (FERGON) 324 MG tablet Take 1 tablet (324 mg total) by mouth every other day. Patient not taking: Reported on 10/14/2022 09/26/22 10/26/22  Myna Hidalgo, DO  gabapentin (NEURONTIN) 300 MG capsule Take 1 capsule (300 mg total) by mouth 3 (three) times daily for 5 days. 09/29/22 10/14/22  Myna Hidalgo, DO  ibuprofen (ADVIL) 600 MG tablet Take 1 tablet (600 mg total) by mouth every 8 (eight) hours as needed for moderate pain. Take with food to prevent GI upset 10/15/22   Cathlean Marseilles A, NP  ondansetron (ZOFRAN-ODT) 4 MG disintegrating tablet Take 1 tablet (4 mg total) by mouth every 8 (eight) hours as needed  for nausea or vomiting. Patient not taking: Reported on 10/14/2022 09/26/22   Myna Hidalgo, DO  Prenatal Vit-Fe Fumarate-FA (PRENATAL VITAMIN PO) Take by mouth. Patient not taking: Reported on 10/14/2022    [provider]    Family History Family History  Problem Relation Age of Onset   Healthy Mother    Healthy Father    Sickle cell trait Son    Arthritis Maternal Grandmother    Dementia Maternal Grandmother    Diabetes Paternal Grandmother     Social History Social History   Tobacco Use   Smoking status: Every Day    Packs/day: 0.50    Years: 13.00    Additional pack years: 0.00    Total pack years: 6.50    Types: Cigarettes    Passive exposure: Never   Smokeless tobacco: Never  Vaping Use   Vaping Use: Never used  Substance Use Topics   Alcohol use: Not  Currently    Comment: occ; not now   Drug use: Yes    Frequency: 2.0 times per week    Types: Marijuana    Comment: cutting back     Allergies   Shellfish-derived products   Review of Systems Review of Systems Per HPI  Physical Exam Triage Vital Signs ED Triage Vitals [10/15/22 0816]  Enc Vitals Group     BP (!) 164/93     Pulse Rate 64     Resp 18     Temp 98.4 F (36.9 C)     Temp Source Oral     SpO2 99 %     Weight      Height      Head Circumference      Peak Flow      Pain Score 10     Pain Loc      Pain Edu?      Excl. in GC?    No data found.  Updated Vital Signs BP (!) 164/93 (BP Location: Right Arm)   Pulse 64   Temp 98.4 F (36.9 C) (Oral)   Resp 18   LMP 09/07/2022 (Approximate)   SpO2 99%   Breastfeeding No   Visual Acuity Right Eye Distance:   Left Eye Distance:   Bilateral Distance:    Right Eye Near:   Left Eye Near:    Bilateral Near:     Physical Exam Vitals and nursing note reviewed.  Constitutional:      General: She is not in acute distress.    Appearance: Normal appearance. She is not toxic-appearing.  HENT:     Head: Normocephalic and atraumatic.     Right Ear: Tympanic membrane, ear canal and external ear normal. There is no impacted cerumen.     Left Ear: Tympanic membrane, ear canal and external ear normal. There is no impacted cerumen.     Nose: Nose normal. No congestion or rhinorrhea.     Mouth/Throat:     Mouth: Mucous membranes are moist.     Dentition: Abnormal dentition. Dental tenderness and dental caries present. No gingival swelling or dental abscesses.     Pharynx: Oropharynx is clear. No posterior oropharyngeal erythema.  Eyes:     General: No scleral icterus.    Extraocular Movements: Extraocular movements intact.  Cardiovascular:     Rate and Rhythm: Normal rate and regular rhythm.  Pulmonary:     Effort: Pulmonary effort is normal. No respiratory distress.  Musculoskeletal:     Cervical back:  Normal range of  motion.  Lymphadenopathy:     Cervical: No cervical adenopathy.  Skin:    General: Skin is warm and dry.     Coloration: Skin is not jaundiced or pale.     Findings: No erythema.  Neurological:     Mental Status: She is alert and oriented to person, place, and time.  Psychiatric:        Behavior: Behavior is cooperative.      UC Treatments / Results  Labs (all labs ordered are listed, but only abnormal results are displayed) Labs Reviewed - No data to display  EKG   Radiology No results found.  Procedures Procedures (including critical care time)  Medications Ordered in UC Medications - No data to display  Initial Impression / Assessment and Plan / UC Course  I have reviewed the triage vital signs and the nursing notes.  Pertinent labs & imaging results that were available during my care of the patient were reviewed by me and considered in my medical decision making (see chart for details).   Patient is well-appearing, afebrile, not tachycardic, not tachypneic, oxygenating well on room air.  Patient is hypertensive in urgent care today, likely secondary to pain.  1. Dentalgia Treat with Augmentin twice daily for 7 days Continue ibuprofen as needed for pain control alternating with Tylenol Recommended close follow-up with dentist and dental resources given Seek care for persistent or worsening symptoms despite treatment  The patient was given the opportunity to ask questions.  All questions answered to their satisfaction.  The patient is in agreement to this plan.    Final Clinical Impressions(s) / UC Diagnoses   Final diagnoses:  Dentalgia     Discharge Instructions      Take the Augmentin as prescribed to treat dental infection  Continue ibuprofen every 6 hours alternating with Tylenol [phone] mg every 6 hours as needed for pain  Follow up with Dentist - list is provided     ED Prescriptions     Medication Sig Dispense Auth.  Provider   ibuprofen (ADVIL) 600 MG tablet Take 1 tablet (600 mg total) by mouth every 8 (eight) hours as needed for moderate pain. Take with food to prevent GI upset 30 tablet Cathlean Marseilles A, NP   amoxicillin-clavulanate (AUGMENTIN) 875-125 MG tablet Take 1 tablet by mouth 2 (two) times daily for 7 days. 14 tablet Valentino Nose, NP      PDMP not reviewed this encounter.   Valentino Nose, NP 10/15/22 [phone]

## 2022-10-16 ENCOUNTER — Telehealth: Payer: Self-pay | Admitting: Clinical

## 2022-10-16 NOTE — Telephone Encounter (Signed)
Attempt call regarding referral; Unable to leave message as "call cannot be completed".

## 2022-10-20 LAB — CYTOLOGY - PAP
Adequacy: ABSENT
Chlamydia: NEGATIVE
Comment: NEGATIVE
Comment: NEGATIVE
Comment: NORMAL
Diagnosis: NEGATIVE
High risk HPV: NEGATIVE
Neisseria Gonorrhea: NEGATIVE

## 2022-10-28 ENCOUNTER — Telehealth: Payer: Self-pay | Admitting: Clinical

## 2022-10-28 NOTE — Telephone Encounter (Signed)
Attempt call regarding referral; Unable to leave message as "call cannot be completed as dialed"

## 2023-03-01 ENCOUNTER — Other Ambulatory Visit: Payer: Self-pay | Admitting: Adult Health

## 2023-03-01 ENCOUNTER — Ambulatory Visit (INDEPENDENT_AMBULATORY_CARE_PROVIDER_SITE_OTHER): Payer: No Typology Code available for payment source

## 2023-03-01 ENCOUNTER — Other Ambulatory Visit: Payer: Self-pay | Admitting: Obstetrics & Gynecology

## 2023-03-01 VITALS — BP 127/77 | HR 87 | Ht 63.0 in | Wt 198.8 lb

## 2023-03-01 DIAGNOSIS — O209 Hemorrhage in early pregnancy, unspecified: Secondary | ICD-10-CM

## 2023-03-01 DIAGNOSIS — Z3201 Encounter for pregnancy test, result positive: Secondary | ICD-10-CM

## 2023-03-01 DIAGNOSIS — R350 Frequency of micturition: Secondary | ICD-10-CM

## 2023-03-01 DIAGNOSIS — O0911 Supervision of pregnancy with history of ectopic or molar pregnancy, first trimester: Secondary | ICD-10-CM

## 2023-03-01 DIAGNOSIS — Z3A00 Weeks of gestation of pregnancy not specified: Secondary | ICD-10-CM

## 2023-03-01 LAB — POCT URINALYSIS DIPSTICK OB
Glucose, UA: NEGATIVE
Ketones, UA: NEGATIVE
Leukocytes, UA: NEGATIVE
Nitrite, UA: NEGATIVE
POC,PROTEIN,UA: NEGATIVE

## 2023-03-01 LAB — POCT URINE PREGNANCY: Preg Test, Ur: POSITIVE — AB

## 2023-03-01 MED ORDER — PRENATAL PLUS 27-1 MG PO TABS: 1.0000 | ORAL_TABLET | Freq: Every day | ORAL | 12 refills | Status: DC

## 2023-03-01 NOTE — Progress Notes (Signed)
Rx PNV ?

## 2023-03-01 NOTE — Progress Notes (Signed)
   NURSE VISIT- PREGNANCY CONFIRMATION   SUBJECTIVE:  Brooke Hunt is a 32 y.o. [phone] female at [redacted]w[redacted]d by uncertain LMP of Patient's last menstrual period was 12/25/2022 (approximate). Here for pregnancy confirmation.  Home pregnancy test: positive x 3 on 02/28/23.   She reports bleeding starting 02/28/23.  Patient states she is anxious because previous pregnancy was a tubal pregnancy. She is not taking prenatal vitamins.  Patient reports receiving antibiotics for a UTI from [facility] recently and still has urinary frequency.  OBJECTIVE:  BP 127/77 (BP Location: Right Arm, Patient Position: Sitting, Cuff Size: Normal)   Pulse 87   Ht 5\' 3"  (1.6 m)   Wt 198 lb 12.8 oz (90.2 kg)   LMP 12/25/2022 (Approximate)   BMI 35.22 kg/m   Appears well, in no apparent distress  Results for orders placed or performed in visit on 03/01/23 (from the past 24 hour(s))  POC Urinalysis Dipstick OB   Collection Time: 03/01/23 11:06 AM  Result Value Ref Range   Color, UA     Clarity, UA     Glucose, UA Negative Negative   Bilirubin, UA     Ketones, UA Negative    Spec Grav, UA     Blood, UA 3+    pH, UA     POC,PROTEIN,UA Negative Negative, Trace, Small (1+), Moderate (2+), Large (3+), 4+   Urobilinogen, UA     Nitrite, UA Negative    Leukocytes, UA Negative Negative   Appearance     Odor    POCT urine pregnancy   Collection Time: 03/01/23 11:08 AM  Result Value Ref Range   Preg Test, Ur Positive (A) Negative    ASSESSMENT: Positive pregnancy test, [redacted]w[redacted]d by LMP    PLAN: Schedule for the next available dating ultrasound. Quantitative HCG and Progesterone labs ordered. Urine sent to for urinalysis and culture and sensitivity. Prenatal vitamins: note routed to [facility] to send prescription.    Nausea medicines: not currently needed   OB packet given: Yes  Caralyn Guile  03/01/2023 11:42 AM

## 2023-03-02 ENCOUNTER — Other Ambulatory Visit: Payer: Self-pay | Admitting: Obstetrics & Gynecology

## 2023-03-02 ENCOUNTER — Encounter: Payer: Self-pay | Admitting: Obstetrics & Gynecology

## 2023-03-02 ENCOUNTER — Other Ambulatory Visit: Payer: Self-pay | Admitting: Adult Health

## 2023-03-02 ENCOUNTER — Ambulatory Visit (INDEPENDENT_AMBULATORY_CARE_PROVIDER_SITE_OTHER): Payer: No Typology Code available for payment source | Admitting: Radiology

## 2023-03-02 ENCOUNTER — Ambulatory Visit (INDEPENDENT_AMBULATORY_CARE_PROVIDER_SITE_OTHER): Payer: No Typology Code available for payment source | Admitting: Obstetrics & Gynecology

## 2023-03-02 VITALS — BP 110/73 | HR 56 | Ht 62.0 in | Wt 200.0 lb

## 2023-03-02 DIAGNOSIS — O3680X0 Pregnancy with inconclusive fetal viability, not applicable or unspecified: Secondary | ICD-10-CM | POA: Diagnosis not present

## 2023-03-02 DIAGNOSIS — Z3A00 Weeks of gestation of pregnancy not specified: Secondary | ICD-10-CM

## 2023-03-02 DIAGNOSIS — Z3201 Encounter for pregnancy test, result positive: Secondary | ICD-10-CM

## 2023-03-02 DIAGNOSIS — O209 Hemorrhage in early pregnancy, unspecified: Secondary | ICD-10-CM

## 2023-03-02 DIAGNOSIS — O0911 Supervision of pregnancy with history of ectopic or molar pregnancy, first trimester: Secondary | ICD-10-CM | POA: Diagnosis not present

## 2023-03-02 LAB — MICROSCOPIC EXAMINATION
Casts: NONE SEEN /[LPF]
WBC, UA: NONE SEEN /[HPF] (ref 0–5)

## 2023-03-02 LAB — URINALYSIS, ROUTINE W REFLEX MICROSCOPIC
Bilirubin, UA: NEGATIVE
Glucose, UA: NEGATIVE
Ketones, UA: NEGATIVE
Leukocytes,UA: NEGATIVE
Nitrite, UA: NEGATIVE
Specific Gravity, UA: 1.023 (ref 1.005–1.030)
Urobilinogen, Ur: 0.2 mg/dL (ref 0.2–1.0)
pH, UA: 5.5 (ref 5.0–7.5)

## 2023-03-02 LAB — BETA HCG QUANT (REF LAB): hCG Quant: 20 m[IU]/mL

## 2023-03-02 LAB — PROGESTERONE: Progesterone: 0.1 ng/mL

## 2023-03-02 NOTE — Progress Notes (Signed)
[place] for + PT with Hx Left ectopic May 2024,  now bleeding Beta HCG 03-01-23 = 20  US performed TA and TV    Chaperone:  Peggy Anteverted Uterus normal in size,  homogeneous myometrium Em thickness = 4.5 mm   no evidence of IUP Normal ovaries bilaterally  Neg adnexal regions - no extrauterine gestation or adnexal mass seen at this time. Neg CDS - no free fluid present   P. Zeek Rostron, RDMS

## 2023-03-03 ENCOUNTER — Encounter: Payer: Self-pay | Admitting: Obstetrics and Gynecology

## 2023-03-03 ENCOUNTER — Encounter: Payer: Self-pay | Admitting: Obstetrics & Gynecology

## 2023-03-03 LAB — URINE CULTURE

## 2023-03-09 ENCOUNTER — Other Ambulatory Visit: Payer: No Typology Code available for payment source

## 2023-03-09 DIAGNOSIS — Z3201 Encounter for pregnancy test, result positive: Secondary | ICD-10-CM

## 2023-03-10 ENCOUNTER — Other Ambulatory Visit: Payer: Self-pay | Admitting: Adult Health

## 2023-03-10 DIAGNOSIS — O3680X0 Pregnancy with inconclusive fetal viability, not applicable or unspecified: Secondary | ICD-10-CM

## 2023-03-10 LAB — BETA HCG QUANT (REF LAB): hCG Quant: 282 m[IU]/mL

## 2023-03-12 ENCOUNTER — Ambulatory Visit: Payer: No Typology Code available for payment source

## 2023-03-12 ENCOUNTER — Ambulatory Visit: Payer: No Typology Code available for payment source | Admitting: Obstetrics & Gynecology

## 2023-03-12 DIAGNOSIS — O3680X0 Pregnancy with inconclusive fetal viability, not applicable or unspecified: Secondary | ICD-10-CM | POA: Diagnosis not present

## 2023-03-12 DIAGNOSIS — Z3A01 Less than 8 weeks gestation of pregnancy: Secondary | ICD-10-CM

## 2023-03-12 DIAGNOSIS — O00109 Unspecified tubal pregnancy without intrauterine pregnancy: Secondary | ICD-10-CM | POA: Diagnosis not present

## 2023-03-12 NOTE — Progress Notes (Signed)
TV US:No IUP visualized,EEC 7.9 mm,normal ovaries,small amount of simple cul de sac fluid,Dr Eure discussed results with patient

## 2023-03-12 NOTE — Progress Notes (Signed)
Follow up appointment for results: Sonogram   Chief Complaint  Patient presents with   Ectopic Pregnancy    Last menstrual period 12/25/2022.  US OB Transvaginal  Result Date: 03/12/2023 Table formatting from the original result was not included. Images from the original result were not included.  ..an [employer] of Ultrasound Medicine Technical sales engineer) accredited practice Center for [facility] [address] [place] [address] Ordering Provider: Adline Potter, NP                                                               FOLLOW UP/   DATING AND VIABILITY SONOGRAM Brooke Hunt is a 32 y.o. year old [id number] with LMP  01/20/2023 which would correlate to  7+2 wks weeks gestation.  She has regular menstrual cycles.   She is here today for a follow up sonogram.No IUP visualized on prior ultrasound with vaginal bleeding. GESTATION: No IUP visualized FETAL ACTIVITY:          Heart rate       n/a          CERVIX: Appears closed ADNEXA: The ovaries are normal. GESTATIONAL AGE AND  BIOMETRICS: Gestational criteria: Estimated Date of Delivery: 10/27/2023 by LMP now at 7+2 wks Previous Scans:1 GESTATIONAL SAC No IUP visualized   CROWN RUMP LENGTH                                                                      WORKING EDD:to be determined  TECHNICIAN COMMENTS: TV US:No IUP visualized,EEC 7.9 mm,normal ovaries,small amount of simple cul de sac fluid,Dr Jayvien Rowlette discussed results with patient Chaperone Kelia A copy of this report including all images has been saved and backed up to a second source for retrieval if needed. All measures and details of the anatomical scan, placentation, fluid volume and pelvic anatomy are contained in that report. Amber Flora Lipps 03/12/2023 12:25 PM Clinical Impression and recommendations: I have reviewed the sonogram results above, combined with the patient's current clinical course, below are my impressions and any appropriate recommendations for  management based on the sonographic findings. No evidence of an intrauterine pregnancy, no fluid collection at all Known non viable pregnancy with progesterone 0.1, with history of left ruptured ectopic with 1500 cc hemoperitoneum [id number] Estimated Date of Delivery: None noted. Normal general sonographic findings Recommend methotrexate therapy at [facility] , discussed with Dr Lucianne Muss and pt is aware of the rationale behind the decision Lazaro Arms 03/12/2023 12:35 PM     HCG 283 03/09/23 with progesterone 0.1 which points to a noin viable pregnancy definitively Question is ectopic vs non viable IUP Sonogram today(viewed by me in real time) shows no adnexal masses but absolutely no evidence of any endometrial fluid which I could potentially convince myself of this being a non viable IUP  History of ruptured ecotpic 1500 cc hemoperitoneum 09/2022  Because of this I defintiely want to be on the hasty side of treatment with methotrexate given she has one tube  and went thru so much previously   MEDS ordered this encounter: No orders of the defined types were placed in this encounter.   Orders for this encounter: No orders of the defined types were placed in this encounter.   Impression + Management Plan   ICD-10-CM   1. Tubal pregnancy, presumed, right, she has no left tube  O00.109    methotrexate therapy today      I spoke with Dr Lucianne Muss regarding patient and she will go down to [facility] for methotrexate therapy Follow Up: Return for thursday of next week 10.24.24 repeat HCG.     All questions were answered.  Past Medical History:  Diagnosis Date   Anemia    Chlamydia     Past Surgical History:  Procedure Laterality Date   DIAGNOSTIC LAPAROSCOPY WITH REMOVAL OF ECTOPIC PREGNANCY N/A 09/25/2022   Procedure: Laparoscopic left salpingectomy and removal of ectopic pregnancy;  Surgeon: Tereso Newcomer, MD;  Location: [facility] OR;  Service: Gynecology;  Laterality: N/A;   NO PAST SURGERIES       OB History     Gravida  4   Para  2   Term  2   Preterm      AB  1   Living  2      SAB      IAB      Ectopic  1   Multiple  0   Live Births  2           Allergies  Allergen Reactions   Shellfish-Derived Products Swelling    Swells nasal passages    Social History   Socioeconomic History   Marital status: Single    Spouse name: Not on file   Number of children: Not on file   Years of education: Not on file   Highest education level: Not on file  Occupational History   Not on file  Tobacco Use   Smoking status: Every Day    Current packs/day: 0.50    Average packs/day: 0.5 packs/day for 13.0 years (6.5 ttl pk-yrs)    Types: Cigarettes    Passive exposure: Never   Smokeless tobacco: Never  Vaping Use   Vaping status: Never Used  Substance and Sexual Activity   Alcohol use: Not Currently    Comment: occ; not now   Drug use: Yes    Frequency: 2.0 times per week    Types: Marijuana    Comment: cutting back   Sexual activity: Yes    Birth control/protection: None  Other Topics Concern   Not on file  Social History Narrative   Not on file   Social Determinants of Health   Financial Resource Strain: Not on file  Food Insecurity: Not on file  Transportation Needs: Not on file  Physical Activity: Not on file  Stress: Not on file  Social Connections: Not on file    Family History  Problem Relation Age of Onset   Healthy Mother    Healthy Father    Sickle cell trait Son    Arthritis Maternal Grandmother    Dementia Maternal Grandmother    Diabetes Paternal Grandmother

## 2023-03-15 ENCOUNTER — Telehealth: Payer: Self-pay | Admitting: Adult Health

## 2023-03-15 NOTE — Telephone Encounter (Signed)
Called pt and asked why she did not go to [facility] Friday, she says she does not have transportation, may could go Thursday or Friday, stressed importance of going to get the methotrexate, she will try to go today, but if can't will call me back

## 2023-03-16 ENCOUNTER — Other Ambulatory Visit: Payer: Self-pay

## 2023-03-16 ENCOUNTER — Inpatient Hospital Stay (HOSPITAL_COMMUNITY)
Admission: AD | Admit: 2023-03-16 | Discharge: 2023-03-16 | Disposition: A | Discharge status: Home / Self Care | Payer: No Typology Code available for payment source | Attending: Obstetrics and Gynecology | Admitting: Obstetrics and Gynecology

## 2023-03-16 ENCOUNTER — Encounter (HOSPITAL_COMMUNITY): Payer: Self-pay | Admitting: Obstetrics and Gynecology

## 2023-03-16 DIAGNOSIS — Z3A01 Less than 8 weeks gestation of pregnancy: Secondary | ICD-10-CM | POA: Diagnosis not present

## 2023-03-16 DIAGNOSIS — O00101 Right tubal pregnancy without intrauterine pregnancy: Secondary | ICD-10-CM | POA: Insufficient documentation

## 2023-03-16 LAB — CBC WITH DIFFERENTIAL/PLATELET
Abs Immature Granulocytes: 0.05 10*3/uL (ref 0.00–0.07)
Basophils Absolute: 0.1 10*3/uL (ref 0.0–0.1)
Basophils Relative: 1 %
Eosinophils Absolute: 0.3 10*3/uL (ref 0.0–0.5)
Eosinophils Relative: 3 %
HCT: 41 % (ref 36.0–46.0)
Hemoglobin: 13 g/dL (ref 12.0–15.0)
Immature Granulocytes: 1 %
Lymphocytes Relative: 25 %
Lymphs Abs: 2.6 10*3/uL (ref 0.7–4.0)
MCH: 25.8 pg — ABNORMAL LOW (ref 26.0–34.0)
MCHC: 31.7 g/dL (ref 30.0–36.0)
MCV: 81.5 fL (ref 80.0–100.0)
Monocytes Absolute: 0.7 10*3/uL (ref 0.1–1.0)
Monocytes Relative: 7 %
Neutro Abs: 6.7 10*3/uL (ref 1.7–7.7)
Neutrophils Relative %: 63 %
Platelets: 375 10*3/uL (ref 150–400)
RBC: 5.03 MIL/uL (ref 3.87–5.11)
RDW: 16.2 % — ABNORMAL HIGH (ref 11.5–15.5)
WBC: 10.5 10*3/uL (ref 4.0–10.5)
nRBC: 0 % (ref 0.0–0.2)

## 2023-03-16 LAB — COMPREHENSIVE METABOLIC PANEL
ALT: 30 U/L (ref 0–44)
AST: 22 U/L (ref 15–41)
Albumin: 4.2 g/dL (ref 3.5–5.0)
Alkaline Phosphatase: 72 U/L (ref 38–126)
Anion gap: 8 (ref 5–15)
BUN: 8 mg/dL (ref 6–20)
CO2: 25 mmol/L (ref 22–32)
Calcium: 9.3 mg/dL (ref 8.9–10.3)
Chloride: 106 mmol/L (ref 98–111)
Creatinine, Ser: 0.8 mg/dL (ref 0.44–1.00)
GFR, Estimated: >60 mL/min (ref >60)
Glucose, Bld: 94 mg/dL (ref 70–99)
Potassium: 4.2 mmol/L (ref 3.5–5.1)
Sodium: 139 mmol/L (ref 135–145)
Total Bilirubin: 0.7 mg/dL (ref 0.3–1.2)
Total Protein: 7.8 g/dL (ref 6.5–8.1)

## 2023-03-16 LAB — HCG, QUANTITATIVE, PREGNANCY: hCG, Beta Chain, Quant, S: 16 m[IU]/mL — ABNORMAL HIGH (ref <5)

## 2023-03-16 MED ORDER — METHOTREXATE FOR ECTOPIC PREGNANCY
50.0000 mg/m2 | Freq: Once | INTRAMUSCULAR | Status: AC
  Administered 2023-03-16: 100 mg via INTRAMUSCULAR
  Filled 2023-03-16: qty 4

## 2023-03-16 NOTE — MAU Note (Signed)
Brooke Hunt is a 32 y.o. at Unknown here in [facility] reporting: here for Methotrexate injection. Denies pain.  Reports currently spotting. LMP: 01/25/2023 Onset of complaint: ongoing Pain score: 0 Vitals:   03/16/23 1208  BP: 126/84  Pulse: 77  Resp: 18  Temp: 98 F (36.7 C)  SpO2: 99%     FHT:NA Lab orders placed from triage:   CBC, CMP, HCG

## 2023-03-16 NOTE — MAU Provider Note (Signed)
Chief Complaint: Methotrexate Injection   Provider seen 1418    SUBJECTIVE HPI: Brooke Hunt is a 32 y.o. [id number] at [redacted]w[redacted]d by LMP who presents to maternity admissions reporting need for methotrexate.  Patient had positive pregnancy test 10/8 which was followed-up with [facility] 10/18. Was found to have right tubal pregnancy. Notably has a hx of left tubal pregnancy in May which required surgery 2/2 hemoperitoneum. Has had period like bleeding for past few days which has lightened to spotting. Did have some mild cramping on right side a few days ago, but no pain today. Denies vaginal discharge, urinary symptoms, fever/chills.  HPI  Past Medical History:  Diagnosis Date   Anemia    Chlamydia    Past Surgical History:  Procedure Laterality Date   DIAGNOSTIC LAPAROSCOPY WITH REMOVAL OF ECTOPIC PREGNANCY N/A 09/25/2022   Procedure: Laparoscopic left salpingectomy and removal of ectopic pregnancy;  Surgeon: Tereso Newcomer, MD;  Location: [facility] OR;  Service: Gynecology;  Laterality: N/A;   NO PAST SURGERIES     Social History   Socioeconomic History   Marital status: Single    Spouse name: Not on file   Number of children: Not on file   Years of education: Not on file   Highest education level: Not on file  Occupational History   Not on file  Tobacco Use   Smoking status: Every Day    Current packs/day: 0.50    Average packs/day: 0.5 packs/day for 13.0 years (6.5 ttl pk-yrs)    Types: Cigarettes    Passive exposure: Never   Smokeless tobacco: Never  Vaping Use   Vaping status: Never Used  Substance and Sexual Activity   Alcohol use: Not Currently    Comment: occ; not now   Drug use: Yes    Frequency: 2.0 times per week    Types: Marijuana    Comment: cutting back   Sexual activity: Yes    Birth control/protection: None  Other Topics Concern   Not on file  Social History Narrative   Not on file   Social Determinants of Health   Financial Resource Strain: Not on file  Food  Insecurity: Not on file  Transportation Needs: Not on file  Physical Activity: Not on file  Stress: Not on file  Social Connections: Not on file  Intimate Partner Violence: Not on file   No current facility-administered medications on file prior to encounter.   Current Outpatient Medications on File Prior to Encounter  Medication Sig Dispense Refill   acetaminophen (TYLENOL) 325 MG tablet Take 2 tablets (650 mg total) by mouth every 6 (six) hours as needed. (Patient not taking: Reported on 03/02/2023)     doxycycline (VIBRAMYCIN) 100 MG capsule Take 100 mg by mouth 2 (two) times daily. (Patient not taking: Reported on 03/02/2023)     ferrous gluconate (FERGON) 324 MG tablet Take 1 tablet (324 mg total) by mouth every other day. (Patient not taking: Reported on 10/14/2022) 30 tablet 0   gabapentin (NEURONTIN) 300 MG capsule Take 1 capsule (300 mg total) by mouth 3 (three) times daily for 5 days. 15 capsule 0   ibuprofen (ADVIL) 600 MG tablet Take 1 tablet (600 mg total) by mouth every 8 (eight) hours as needed for moderate pain. Take with food to prevent GI upset (Patient not taking: Reported on 03/01/2023) 30 tablet 0   moxifloxacin (AVELOX) 400 MG tablet Take 400 mg by mouth daily. (Patient not taking: Reported on 03/01/2023)  ondansetron (ZOFRAN-ODT) 4 MG disintegrating tablet Take 1 tablet (4 mg total) by mouth every 8 (eight) hours as needed for nausea or vomiting. (Patient not taking: Reported on 10/14/2022) 20 tablet 0   prenatal vitamin w/FE, FA (PRENATAL 1 + 1) 27-1 MG TABS tablet Take 1 tablet by mouth daily at 12 noon. (Patient not taking: Reported on 03/02/2023) 30 tablet 12   sulfamethoxazole-trimethoprim (BACTRIM DS) 800-160 MG tablet SMARTSIG:1 Tablet(s) By Mouth Every 12 Hours (Patient not taking: Reported on 03/02/2023)     Allergies  Allergen Reactions   Shellfish-Derived Products Swelling    Swells nasal passages    ROS:  Pertinent positives/negatives listed above.  I have  reviewed patient's Past Medical Hx, Surgical Hx, Family Hx, Social Hx, medications and allergies.   Physical Exam  Patient Vitals for the past 24 hrs:  BP Temp Temp src Pulse Resp SpO2 Height Weight  03/16/23 1208 126/84 98 F (36.7 C) Oral 77 18 99 % -- --  03/16/23 1202 -- -- -- -- -- -- 5\' 2"  (1.575 m) 89.5 kg   Constitutional: Well-developed, well-nourished female in no acute distress  Cardiovascular: normal rate Respiratory: normal effort GI: Abd soft, non-tender. Pos BS x 4 MS: Extremities nontender, no edema, normal ROM Neurologic: Alert and oriented x 4.   LAB RESULTS Results for orders placed or performed during the hospital encounter of 03/16/23 (from the past 24 hour(s))  CBC with Differential/Platelet     Status: Abnormal   Collection Time: 03/16/23 12:17 PM  Result Value Ref Range   WBC 10.5 4.0 - 10.5 K/uL   RBC 5.03 3.87 - 5.11 MIL/uL   Hemoglobin 13.0 12.0 - 15.0 g/dL   HCT 84.1 66.0 - 63.0 %   MCV 81.5 80.0 - 100.0 fL   MCH 25.8 (L) 26.0 - 34.0 pg   MCHC 31.7 30.0 - 36.0 g/dL   RDW 16.0 (H) 10.9 - 32.3 %   Platelets 375 150 - 400 K/uL   nRBC 0.0 0.0 - 0.2 %   Neutrophils Relative % 63 %   Neutro Abs 6.7 1.7 - 7.7 K/uL   Lymphocytes Relative 25 %   Lymphs Abs 2.6 0.7 - 4.0 K/uL   Monocytes Relative 7 %   Monocytes Absolute 0.7 0.1 - 1.0 K/uL   Eosinophils Relative 3 %   Eosinophils Absolute 0.3 0.0 - 0.5 K/uL   Basophils Relative 1 %   Basophils Absolute 0.1 0.0 - 0.1 K/uL   Immature Granulocytes 1 %   Abs Immature Granulocytes 0.05 0.00 - 0.07 K/uL  Comprehensive metabolic panel     Status: None   Collection Time: 03/16/23 12:17 PM  Result Value Ref Range   Sodium 139 135 - 145 mmol/L   Potassium 4.2 3.5 - 5.1 mmol/L   Chloride 106 98 - 111 mmol/L   CO2 25 22 - 32 mmol/L   Glucose, Bld 94 70 - 99 mg/dL   BUN 8 6 - 20 mg/dL   Creatinine, Ser 5.57 0.44 - 1.00 mg/dL   Calcium 9.3 8.9 - 32.2 mg/dL   Total Protein 7.8 6.5 - 8.1 g/dL   Albumin 4.2 3.5  - 5.0 g/dL   AST 22 15 - 41 U/L   ALT 30 0 - 44 U/L   Alkaline Phosphatase 72 38 - 126 U/L   Total Bilirubin 0.7 0.3 - 1.2 mg/dL   GFR, Estimated >02 >54 mL/min   Anion gap 8 5 - 15  hCG, quantitative, pregnancy  Status: Abnormal   Collection Time: 03/16/23 12:17 PM  Result Value Ref Range   hCG, Beta Chain, Quant, S 16 (H) <5 mIU/mL    --/--/O POS (05/03 1815)  IMAGING US OB Transvaginal  Result Date: 03/12/2023 Table formatting from the original result was not included. Images from the original result were not included.  ..an [employer] Technical sales engineer) accredited practice Center for [facility] [address] [place] [address] Ordering Provider: Adline Potter, NP                                                               FOLLOW UP/   DATING AND VIABILITY SONOGRAM Brooke Hunt is a 32 y.o. year old [id number] with LMP  01/20/2023 which would correlate to  7+2 wks weeks gestation.  She has regular menstrual cycles.   She is here today for a follow up sonogram.No IUP visualized on prior ultrasound with vaginal bleeding. GESTATION: No IUP visualized FETAL ACTIVITY:          Heart rate       n/a          CERVIX: Appears closed ADNEXA: The ovaries are normal. GESTATIONAL AGE AND  BIOMETRICS: Gestational criteria: Estimated Date of Delivery: 10/27/2023 by LMP now at 7+2 wks Previous Scans:1 GESTATIONAL SAC No IUP visualized   CROWN RUMP LENGTH                                                                      WORKING EDD:to be determined  TECHNICIAN COMMENTS: TV US:No IUP visualized,EEC 7.9 mm,normal ovaries,small amount of simple cul de sac fluid,Dr Eure discussed results with patient Chaperone Kelia A copy of this report including all images has been saved and backed up to a second source for retrieval if needed. All measures and details of the anatomical scan, placentation, fluid volume and pelvic anatomy are contained in that report.  Amber Flora Lipps 03/12/2023 12:25 PM Clinical Impression and recommendations: I have reviewed the sonogram results above, combined with the patient's current clinical course, below are my impressions and any appropriate recommendations for management based on the sonographic findings. No evidence of an intrauterine pregnancy, no fluid collection at all Known non viable pregnancy with progesterone 0.1, with history of left ruptured ectopic with 1500 cc hemoperitoneum [id number] Estimated Date of Delivery: None noted. Normal general sonographic findings Recommend methotrexate therapy at MAU , discussed with Dr Lucianne Muss and pt is aware of the rationale behind the decision Lazaro Arms 03/12/2023 12:35 PM    MAU Management/MDM: Orders Placed This Encounter  Procedures   CBC with Differential/Platelet   Comprehensive metabolic panel   hCG, quantitative, pregnancy   Discharge patient    Meds ordered this encounter  Medications   methotrexate (for ectopic pregnancy) 25 mg/mL chemo injection    Patient with confirmed ectopic pregnancy in right tube from ultrasound on 10/18. Currently is having minimal symptoms with vaginal spotting. No abdominal pain today to suggest rupture. At  this time, patient qualifies for methotrexate treatment and is amenable to this. CBC, CMP, HCG obtained prior to administration. Methotrexate given, and patient tolerated well. Message sent to [facility] for follow-up HCG on Friday (day #4). Return precautions for ruptured ectopic given.  ASSESSMENT 1. Right tubal pregnancy without intrauterine pregnancy     PLAN Discharge home with strict return precautions. Allergies as of 03/16/2023       Reactions   Shellfish-derived Products Swelling   Swells nasal passages        Medication List     STOP taking these medications    acetaminophen 325 MG tablet Commonly known as: Tylenol   doxycycline 100 MG capsule Commonly known as: VIBRAMYCIN   ferrous gluconate 324 MG  tablet Commonly known as: FERGON   gabapentin 300 MG capsule Commonly known as: Neurontin   ibuprofen 600 MG tablet Commonly known as: ADVIL   moxifloxacin 400 MG tablet Commonly known as: AVELOX   ondansetron 4 MG disintegrating tablet Commonly known as: ZOFRAN-ODT   prenatal vitamin w/FE, FA 27-1 MG Tabs tablet   sulfamethoxazole-trimethoprim 800-160 MG tablet Commonly known as: BACTRIM DS         Wylene Simmer, MD OB Fellow 03/16/2023  2:39 PM

## 2023-03-19 ENCOUNTER — Other Ambulatory Visit: Payer: No Typology Code available for payment source

## 2023-03-19 ENCOUNTER — Other Ambulatory Visit (HOSPITAL_COMMUNITY): Payer: No Typology Code available for payment source

## 2023-03-20 LAB — BETA HCG QUANT (REF LAB): hCG Quant: 4 m[IU]/mL

## 2023-06-26 DIAGNOSIS — Z419 Encounter for procedure for purposes other than remedying health state, unspecified: Secondary | ICD-10-CM | POA: Diagnosis not present

## 2023-07-24 DIAGNOSIS — Z419 Encounter for procedure for purposes other than remedying health state, unspecified: Secondary | ICD-10-CM | POA: Diagnosis not present

## 2023-08-02 ENCOUNTER — Ambulatory Visit
Admission: EM | Admit: 2023-08-02 | Discharge: 2023-08-02 | Disposition: A | Discharge status: Home / Self Care | Attending: Nurse Practitioner | Admitting: Nurse Practitioner

## 2023-08-02 DIAGNOSIS — S61216A Laceration without foreign body of right little finger without damage to nail, initial encounter: Secondary | ICD-10-CM

## 2023-08-02 DIAGNOSIS — Z203 Contact with and (suspected) exposure to rabies: Secondary | ICD-10-CM

## 2023-08-02 DIAGNOSIS — S61411A Laceration without foreign body of right hand, initial encounter: Secondary | ICD-10-CM

## 2023-08-02 MED ORDER — TETANUS-DIPHTH-ACELL PERTUSSIS 5-2.5-18.5 LF-MCG/0.5 IM SUSY
0.5000 mL | PREFILLED_SYRINGE | Freq: Once | INTRAMUSCULAR | Status: AC
  Administered 2023-08-02: 0.5 mL via INTRAMUSCULAR

## 2023-08-02 NOTE — ED Triage Notes (Signed)
 Pt presents with cut to right palm and pinky finger that happened yesterday while trying to open a can.   Pt is currently trying to get pregnant, does not know if she is pregnant currently.

## 2023-08-02 NOTE — Discharge Instructions (Signed)
 Clean the areas twice daily with mild soap and water.  After, apply thin layer of Neosporin or topical antibiotic ointment and cover with a Band-Aid until it heals.  Once it heals, you can leave it open to air.  We updated your tetanus shot today.  If you develop thick, smelly drainage from the wound or redness surrounding the wound, please return to be reevaluated.

## 2023-08-02 NOTE — ED Notes (Signed)
 Assessed pt laceration, pt states she cut her hand opening a can yesterday. Small laceration on palm and right pinky finger. Pt is currently not bleeding. Wrapped her hand with non-stick bandage.

## 2023-08-02 NOTE — ED Provider Notes (Signed)
 [facility]    CSN: [id number] Arrival date & time: 08/02/23  1235      History   Chief Complaint Chief Complaint  Patient presents with   Laceration    HPI Brooke Hunt is a 33 y.o. female.   Patient presents today with 2 cuts to her right hand that she sustained approximately 18 hours ago while opening a can of green beans.  Reports the can opener did not work off the way, show she was using a spoon to open the can the rest of the way when her hand slipped and she cut herself on the top of the can.  Reports the bleeding has been controlled.  She has cleaned the area and put Neosporin on it.  No numbness or tingling in the tip of the digit.  No swelling or drainage.  Reports after applying the Neosporin, she wrapped her hand and a nonstick bandage.    Past Medical History:  Diagnosis Date   Anemia    Chlamydia     Patient Active Problem List   Diagnosis Date Noted   Ruptured left tubal ectopic pregnancy causing hemoperitoneum 08/12/2022   Marijuana use 04/23/2015   History of chlamydia 04/17/2015   Smoker 04/17/2015    Past Surgical History:  Procedure Laterality Date   DIAGNOSTIC LAPAROSCOPY WITH REMOVAL OF ECTOPIC PREGNANCY N/A 09/25/2022   Procedure: Laparoscopic left salpingectomy and removal of ectopic pregnancy;  Surgeon: Tereso Newcomer, MD;  Location: [facility] OR;  Service: Gynecology;  Laterality: N/A;   NO PAST SURGERIES      OB History     Gravida  4   Para  2   Term  2   Preterm      AB  1   Living  2      SAB      IAB      Ectopic  1   Multiple  0   Live Births  2            Home Medications    Prior to Admission medications   Not on File    Family History Family History  Problem Relation Age of Onset   Healthy Mother    Healthy Father    Sickle cell trait Son    Arthritis Maternal Grandmother    Dementia Maternal Grandmother    Diabetes Paternal Grandmother     Social History Social History   Tobacco  Use   Smoking status: Every Day    Current packs/day: 0.50    Average packs/day: 0.5 packs/day for 13.0 years (6.5 ttl pk-yrs)    Types: Cigarettes    Passive exposure: Never   Smokeless tobacco: Never  Vaping Use   Vaping status: Never Used  Substance Use Topics   Alcohol use: Not Currently    Comment: occ; not now   Drug use: Yes    Frequency: 2.0 times per week    Types: Marijuana    Comment: cutting back     Allergies   Shellfish-derived products   Review of Systems Review of Systems Per HPI  Physical Exam Triage Vital Signs ED Triage Vitals  Encounter Vitals Group     BP 08/02/23 1406 122/81     Systolic BP Percentile --      Diastolic BP Percentile --      Pulse Rate 08/02/23 1406 82     Resp 08/02/23 1406 18     Temp 08/02/23 1406 97.7 F (36.5 C)  Temp Source 08/02/23 1406 Oral     SpO2 08/02/23 1406 98 %     Weight --      Height --      Head Circumference --      Peak Flow --      Pain Score 08/02/23 1407 0     Pain Loc --      Pain Education --      Exclude from Growth Chart --    No data found.  Updated Vital Signs BP 122/81 (BP Location: Right Arm)   Pulse 82   Temp 97.7 F (36.5 C) (Oral)   Resp 18   LMP 07/29/2023 (Approximate)   SpO2 98%   Breastfeeding No   Visual Acuity Right Eye Distance:   Left Eye Distance:   Bilateral Distance:    Right Eye Near:   Left Eye Near:    Bilateral Near:     Physical Exam Vitals and nursing note reviewed.  Constitutional:      General: She is not in acute distress.    Appearance: Normal appearance. She is not toxic-appearing.  HENT:     Mouth/Throat:     Mouth: Mucous membranes are moist.     Pharynx: Oropharynx is clear.  Pulmonary:     Effort: Pulmonary effort is normal. No respiratory distress.  Skin:    General: Skin is warm and dry.     Capillary Refill: Capillary refill takes less than 2 seconds.     Findings: Laceration present.     Comments: Two 1 cm lacerations noted to  right palm and proximal right palmar fifth digit.  No surrounding erythema, active drainage, warmth.  No active bleeding.  The distal right upper extremity is neurovascularly intact.  Patient has full range of motion and sensation of the right upper extremity distally.  Neurological:     Mental Status: She is alert and oriented to person, place, and time.  Psychiatric:        Behavior: Behavior is cooperative.      UC Treatments / Results  Labs (all labs ordered are listed, but only abnormal results are displayed) Labs Reviewed - No data to display  EKG   Radiology No results found.  Procedures Procedures (including critical care time)  Medications Ordered in UC Medications  Tdap (BOOSTRIX) injection 0.5 mL (0.5 mLs Intramuscular Given 08/02/23 1429)    Initial Impression / Assessment and Plan / UC Course  I have reviewed the triage vital signs and the nursing notes.  Pertinent labs & imaging results that were available during my care of the patient were reviewed by me and considered in my medical decision making (see chart for details).   Patient is well-appearing, normotensive, afebrile, not tachycardic, not tachypneic, oxygenating well on room air.    1. Laceration of palm, right, initial encounter 2. Laceration of right little finger without foreign body without damage to nail, initial encounter Wound closure not indicated today Wound care discussed with patient and applied today Tdap updated Return and ER precautions discussed  The patient was given the opportunity to ask questions.  All questions answered to their satisfaction.  The patient is in agreement to this plan.    Final Clinical Impressions(s) / UC Diagnoses   Final diagnoses:  Laceration of palm, right, initial encounter  Laceration of right little finger without foreign body without damage to nail, initial encounter     Discharge Instructions      Clean the areas twice daily with  mild soap and  water.  After, apply thin layer of Neosporin or topical antibiotic ointment and cover with a Band-Aid until it heals.  Once it heals, you can leave it open to air.  We updated your tetanus shot today.  If you develop thick, smelly drainage from the wound or redness surrounding the wound, please return to be reevaluated.    ED Prescriptions   None    PDMP not reviewed this encounter.   Valentino Nose, NP 08/02/23 [phone]

## 2023-09-04 DIAGNOSIS — Z419 Encounter for procedure for purposes other than remedying health state, unspecified: Secondary | ICD-10-CM | POA: Diagnosis not present

## 2023-09-17 ENCOUNTER — Ambulatory Visit: Admitting: *Deleted

## 2023-09-17 ENCOUNTER — Encounter: Payer: Self-pay | Admitting: *Deleted

## 2023-09-17 ENCOUNTER — Other Ambulatory Visit: Payer: Self-pay | Admitting: Adult Health

## 2023-09-17 VITALS — BP 125/76 | HR 76 | Wt 204.8 lb

## 2023-09-17 DIAGNOSIS — Z3201 Encounter for pregnancy test, result positive: Secondary | ICD-10-CM

## 2023-09-17 LAB — POCT URINE PREGNANCY: Preg Test, Ur: POSITIVE — AB

## 2023-09-17 MED ORDER — ONDANSETRON HCL 4 MG PO TABS: 4.0000 mg | ORAL_TABLET | Freq: Three times a day (TID) | ORAL | 1 refills | Status: DC | PRN

## 2023-09-17 NOTE — Progress Notes (Signed)
   NURSE VISIT- PREGNANCY CONFIRMATION   SUBJECTIVE:  Brooke Hunt is a 33 y.o. [phone] female at [redacted]w[redacted]d by certain LMP of Patient's last menstrual period was 07/26/2023. Here for pregnancy confirmation.  Home pregnancy test: positive x 1   She reports nausea.  She is not taking prenatal vitamins.    OBJECTIVE:  BP 125/76 (BP Location: Right Arm, Patient Position: Sitting, Cuff Size: Normal)   Pulse 76   Wt 204 lb 12.8 oz (92.9 kg)   LMP 07/26/2023   BMI 37.46 kg/m   Appears well, in no apparent distress  No results found for this or any previous visit (from the past 24 hours).  ASSESSMENT: Positive pregnancy test, [redacted]w[redacted]d by LMP    PLAN: Schedule for dating ultrasound in 1 week Prenatal vitamins: plans to begin OTC ASAP   Nausea medicines: requested-note routed to [facility]  to send prescription   OB packet given: Yes  Kerrie Peek  09/17/2023 10:54 AM

## 2023-09-17 NOTE — Progress Notes (Signed)
 Rx zofran

## 2023-09-20 ENCOUNTER — Encounter

## 2023-09-27 ENCOUNTER — Encounter: Payer: Self-pay | Admitting: Adult Health

## 2023-09-27 ENCOUNTER — Ambulatory Visit: Admitting: Radiology

## 2023-09-27 ENCOUNTER — Encounter (HOSPITAL_COMMUNITY): Payer: Self-pay | Admitting: Obstetrics & Gynecology

## 2023-09-27 ENCOUNTER — Other Ambulatory Visit: Payer: Self-pay | Admitting: Obstetrics & Gynecology

## 2023-09-27 ENCOUNTER — Other Ambulatory Visit: Payer: Self-pay

## 2023-09-27 ENCOUNTER — Ambulatory Visit: Admitting: Adult Health

## 2023-09-27 ENCOUNTER — Inpatient Hospital Stay (HOSPITAL_COMMUNITY)
Admission: AD | Admit: 2023-09-27 | Discharge: 2023-09-27 | Disposition: A | Discharge status: Home / Self Care | Attending: Obstetrics & Gynecology | Admitting: Obstetrics & Gynecology

## 2023-09-27 VITALS — BP 128/80 | HR 97 | Ht 63.0 in | Wt 206.5 lb

## 2023-09-27 DIAGNOSIS — Z3A09 9 weeks gestation of pregnancy: Secondary | ICD-10-CM | POA: Insufficient documentation

## 2023-09-27 DIAGNOSIS — O3680X0 Pregnancy with inconclusive fetal viability, not applicable or unspecified: Secondary | ICD-10-CM

## 2023-09-27 DIAGNOSIS — O00101 Right tubal pregnancy without intrauterine pregnancy: Secondary | ICD-10-CM

## 2023-09-27 DIAGNOSIS — O00109 Unspecified tubal pregnancy without intrauterine pregnancy: Secondary | ICD-10-CM

## 2023-09-27 LAB — CBC
HCT: 39.1 % (ref 36.0–46.0)
Hemoglobin: 12.7 g/dL (ref 12.0–15.0)
MCH: 26.6 pg (ref 26.0–34.0)
MCHC: 32.5 g/dL (ref 30.0–36.0)
MCV: 82 fL (ref 80.0–100.0)
Platelets: 344 10*3/uL (ref 150–400)
RBC: 4.77 MIL/uL (ref 3.87–5.11)
RDW: 15.6 % — ABNORMAL HIGH (ref 11.5–15.5)
WBC: 10.9 10*3/uL — ABNORMAL HIGH (ref 4.0–10.5)
nRBC: 0 % (ref 0.0–0.2)

## 2023-09-27 LAB — COMPREHENSIVE METABOLIC PANEL WITH GFR
ALT: 22 U/L (ref 0–44)
AST: 18 U/L (ref 15–41)
Albumin: 4.1 g/dL (ref 3.5–5.0)
Alkaline Phosphatase: 58 U/L (ref 38–126)
Anion gap: 10 (ref 5–15)
BUN: 9 mg/dL (ref 6–20)
CO2: 24 mmol/L (ref 22–32)
Calcium: 9.1 mg/dL (ref 8.9–10.3)
Chloride: 106 mmol/L (ref 98–111)
Creatinine, Ser: 0.71 mg/dL (ref 0.44–1.00)
GFR, Estimated: >60 mL/min (ref >60)
Glucose, Bld: 97 mg/dL (ref 70–99)
Potassium: 4 mmol/L (ref 3.5–5.1)
Sodium: 140 mmol/L (ref 135–145)
Total Bilirubin: 0.6 mg/dL (ref 0.0–1.2)
Total Protein: 7.5 g/dL (ref 6.5–8.1)

## 2023-09-27 LAB — HCG, QUANTITATIVE, PREGNANCY: hCG, Beta Chain, Quant, S: 11815 m[IU]/mL — ABNORMAL HIGH (ref <5)

## 2023-09-27 MED ORDER — METHOTREXATE FOR ECTOPIC PREGNANCY
50.0000 mg/m2 | Freq: Once | INTRAMUSCULAR | Status: AC
  Administered 2023-09-27: 100 mg via INTRAMUSCULAR
  Filled 2023-09-27 (×2): qty 4

## 2023-09-27 NOTE — MAU Note (Signed)
 MAU Triage Note  Brooke Hunt is a 33 y.o. at [redacted]w[redacted]d here in MAU reporting: she was instructed to go to MAU for ectopic pregnancy.  Denies VB and pain.  Reports was told no yolk sac, but the heart was low.  LMP: 07/26/2023 Onset of complaint: today Pain score: 0 Vitals:   09/27/23 1813  BP: 126/77  Pulse: 69  Resp: 18  Temp: 98.3 F (36.8 C)  SpO2: 100%     FHT: NA  Lab orders placed from triage: None

## 2023-09-27 NOTE — MAU Note (Signed)
Pt's sister arrived and is at bedside.

## 2023-09-27 NOTE — Progress Notes (Signed)
 US : GA by LMP = 9+0 weeks Anteverted uterus normal in size, endometrial thickness = 12.1 mm, no evidence of IUP Normal Left ovary - neg Left adnexal region Normal Right ovary with Live Right ectopic seen medial and adjacent to the Right ovary.  Size of entire tubal pregnancy - 29 x 25 x 23 mm   size of gestational sac = 9.3 mm = 5+5 weeks  CRL = 3.9 mm = 6+1 weeks slow faint heart motion seen.  (See cine clips) Tiny amount of clear free fluid within CDS, no free fluid seen elsewhere.

## 2023-09-27 NOTE — MAU Provider Note (Signed)
 Chief Complaint:  Ectopic Pregnancy   HPI   None     Brooke Hunt is a 33 y.o. [id number] at [redacted]w[redacted]d who presents to maternity admissions for right ectopic pregnancy. Had an appointment for dating US  at [facility] earlier today, found right ectopic, with faint FHM, CRL 3.9 mm, with GS 9.3 mm and no YS seen, tube measures 29 x 25 mm. She has a history of left ectopic 09/25/22 with left salpingectomy and ectopic removal. This is her third ectopic pregnancy.  She denied pain or bleeding at the time of her office visit. OB sent her to MAU for further management. She last ate at approximately 1300.   Patient does not have support person in room currently.  Pregnancy Course: Receives care at [facility]. Records and US  reviewed.  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   Ruptured left tubal ectopic pregnancy causing hemoperitoneum 08/12/2022   OB History  Gravida Para Term Preterm AB Living  5 3 3  1 2   SAB IAB Ectopic Multiple Live Births    1 0 2    # Outcome Date GA Lbr Len/2nd Weight Sex Type Anes PTL Lv  5 Current           4 Term 03/16/23     ECTOPIC     3 Ectopic 09/25/22     ECTOPIC     2 Term 06/15/15 [redacted]w[redacted]d   F Vag-Spont  N LIV  1 Term 06/17/14 102w4d 15:35 / 00:12 3075 g M Vag-Spont None N LIV     Birth Comments: Neg RPR, HIV, Hep B + UDS for THC with hx of maternal use throughout pregnancy + maternal chlamydia in pregnancy but neg PTD  HB FAS   Past Surgical History:  Procedure Laterality Date   DIAGNOSTIC LAPAROSCOPY WITH REMOVAL OF ECTOPIC PREGNANCY N/A 09/25/2022   Procedure: Laparoscopic left salpingectomy and removal of ectopic pregnancy;  Surgeon: Julianne Octave, MD;  Location: [facility] OR;  Service: Gynecology;  Laterality: N/A;   Family History  Problem Relation Age of Onset   Healthy Mother    Miscarriages / [place] Mother    Healthy Father    Miscarriages / Stillbirths Sister     Sickle cell trait Son    Arthritis Maternal Grandmother    Dementia Maternal Grandmother    Diabetes Paternal Grandmother    Social History   Tobacco Use   Smoking status: Former    Current packs/day: 0.50    Average packs/day: 0.5 packs/day for 13.0 years (6.5 ttl pk-yrs)    Types: Cigarettes    Passive exposure: Never   Smokeless tobacco: Never  Vaping Use   Vaping status: Never Used  Substance Use Topics   Alcohol use: Not Currently    Comment: occ; not now   Drug use: Yes    Frequency: 2.0 times per week    Types: Marijuana    Comment: cutting back   Allergies  Allergen Reactions   Shellfish-Derived Products Swelling    Swells nasal passages   No medications prior to admission.    I have reviewed patient's Past Medical Hx, Surgical Hx, Family Hx, Social Hx, medications and allergies.   ROS  Pertinent items noted in HPI and remainder of comprehensive ROS otherwise negative.   PHYSICAL EXAM  Patient Vitals for the past 24 hrs:  BP Temp Temp src Pulse Resp SpO2  Height Weight  09/27/23 1836 -- -- -- 99 -- 97 % -- --  09/27/23 1833 -- -- -- -- 18 -- -- --  09/27/23 1813 126/77 98.3 F (36.8 C) Oral 69 18 100 % -- --  09/27/23 1809 -- -- -- -- -- -- 5\' 3"  (1.6 m) 89.8 kg    Constitutional: Well-developed, well-nourished female in no acute distress. Patient is tearful. HEENT: atraumatic, normocephalic. Neck has normal ROM. EOM intact. Cardiovascular: normal rate & rhythm, warm and well-perfused Respiratory: normal effort, no problems with respiration noted GI: Abd soft, non-tender, non-distended MSK: Extremities nontender, no edema, normal ROM Skin: warm and dry. Acyanotic, no jaundice or pallor. Neurologic: Alert and oriented x 4. No abnormal coordination. Psychiatric: Speech not slurred, not rapid/pressured. Patient is cooperative. GU: no CVA tenderness    Labs: Results for orders placed or performed during the hospital encounter of 09/27/23 (from the past  24 hours)  hCG, quantitative, pregnancy     Status: Abnormal   Collection Time: 09/27/23  6:19 PM  Result Value Ref Range   hCG, Beta Chain, Quant, S 11,815 (H) <5 mIU/mL  CBC     Status: Abnormal   Collection Time: 09/27/23  6:19 PM  Result Value Ref Range   WBC 10.9 (H) 4.0 - 10.5 K/uL   RBC 4.77 3.87 - 5.11 MIL/uL   Hemoglobin 12.7 12.0 - 15.0 g/dL   HCT 16.1 09.6 - 04.5 %   MCV 82.0 80.0 - 100.0 fL   MCH 26.6 26.0 - 34.0 pg   MCHC 32.5 30.0 - 36.0 g/dL   RDW 40.9 (H) 81.1 - 91.4 %   Platelets 344 150 - 400 K/uL   nRBC 0.0 0.0 - 0.2 %  Comprehensive metabolic panel     Status: None   Collection Time: 09/27/23  6:19 PM  Result Value Ref Range   Sodium 140 135 - 145 mmol/L   Potassium 4.0 3.5 - 5.1 mmol/L   Chloride 106 98 - 111 mmol/L   CO2 24 22 - 32 mmol/L   Glucose, Bld 97 70 - 99 mg/dL   BUN 9 6 - 20 mg/dL   Creatinine, Ser 7.82 0.44 - 1.00 mg/dL   Calcium 9.1 8.9 - 95.6 mg/dL   Total Protein 7.5 6.5 - 8.1 g/dL   Albumin  4.1 3.5 - 5.0 g/dL   AST 18 15 - 41 U/L   ALT 22 0 - 44 U/L   Alkaline Phosphatase 58 38 - 126 U/L   Total Bilirubin 0.6 0.0 - 1.2 mg/dL   GFR, Estimated >21 >30 mL/min   Anion gap 10 5 - 15    Imaging:  No results found.  MDM & MAU COURSE  MDM: High  MAU Course: -Vital signs within normal limits. -CBC, CMP, and beta-hCG for baseline. -Working on contacting support person for patient since she does not have her phone. -CBC shows WBC slightly elevated 10.9. CMP within normal limits including liver function. -Day 1 beta-hCG 11,815. -Able to contact significant other, patient spoke with him on the phone. After phone call, patient tearful and expressed frustration with partner blaming her for ectopic pregnancy, previous episodes of partner yelling, controlling nature, comments about transvaginal ultrasound earlier today. Discussed steps if she is uncomfortable with him in the room, patient agreeable to code word GRAPES to remove partner from room.  Discussed that there are options available outpatient for additional support if she feels unsafe or situation escalates.  -Patient able to contact her cousin who  is going to come to the hospital for support. -Dr. Donetta Furl consulted and discussed management options with patient at bedside, this provider also present for discussion. Options include methotrexate  versus surgical management. Surgical management recommended due to risk of treatment failure (beta-hCG >5000, fetal cardiac activity) and rupture (location of pregnancy) with methotrexate . Patient requesting time to think about options and discuss with support person who is on the way. -After thinking about the options, patient desires to maintain fertility and would prefer methotrexate  regimen. Dr. Donetta Furl updated, would like to see patient again prior to initiating methotrexate . Currently in surgery, patient aware that she will be seen as soon as Dr. Donetta Furl is available. -Dr. Donetta Furl revisited recommendation for surgical management given fetal cardiac activity, beta-hCG level, pregnancy location. Patient verbalized understanding of risk of rupture or treatment failure.   Orders Placed This Encounter  Procedures   hCG, quantitative, pregnancy   CBC   Comprehensive metabolic panel   hCG, quantitative, pregnancy   hCG, quantitative, pregnancy   CBC with Differential/Platelet   Comprehensive metabolic panel   Meds ordered this encounter  Medications   methotrexate  (for ectopic pregnancy) 25 mg/mL chemo injection    ASSESSMENT   1. Right tubal pregnancy without intrauterine pregnancy   2. [redacted] weeks gestation of pregnancy     PLAN  Discharged home in stable condition with return precautions, low threshold for return for nausea/vomiting, abdominal pain, vaginal bleeding. Plan for trial of methotrexate  x2. If this fails, will plan to proceed with surgical intervention. Day 1 (09/27/2023): methotrexate  50mg /m2  Day 4 (09/30/2023): hCG,  methotrexate  50mg /m2 Day 7 (10/03/2023): hCG, CBC, CMP    Follow-up Information     [facility] Follow up on 09/30/2023.   Specialty: Obstetrics and Gynecology Why: for methotrexate  follow up Contact information: [address] [place]  [address] [phone]                 Allergies as of 09/27/2023       Reactions   Shellfish-derived Products Swelling   Swells nasal passages        Medication List    You have not been prescribed any medications.     Noreene Bearded, PA

## 2023-09-27 NOTE — Progress Notes (Signed)
 Pt states she wants to receive the methotrexate  injection for treatment. Caridad Chard, PA made aware.

## 2023-09-27 NOTE — MAU Note (Signed)
 Patient requested staff to call emergency contact, Erla Haw, as she believes she left her phone in his car and she needs to know if he is dropping the kids off and coming back. RN called number on file and received message that number is no longer in service.  RN asked for clarification on number and if there are any additional numbers she would like for us  to call and that we also have a phone available that she can use.   RN called Ova Bloomer, patient's mother, [phone] - Samantha answered and RN informed her that her daughter was requesting she come to the hospital to be with here and that she couldn't give out any more information than that. Ova Bloomer reported that she was not in the [place] area but that I should give her sister Jakie Mayhew a try.  RN got permission from patient to try calling Ayesha also. RN called [phone] and went to voicemail. RN also tried an additional number at [phone] and did not get an answer from that number either.

## 2023-09-27 NOTE — Progress Notes (Signed)
  Subjective:     Patient ID: Brooke Hunt, female   DOB: [birth year], 33 y.o.   MRN: [id number]  HPI Brooke Hunt is a 33 year old black female,single, [id number], in for dating US  found to have right ectopic, with faint FHM, CRL 3.9 mm, with GS 9.3 mm an no YS seen, tube measures 29 x 25 mm. She had left ectopic 09/25/22 with left salpingectomy and ectopic removal. This is her third ectopic. Blood type is O+ in EMR. Pt last ate about 1 pm she says.      Component Value Date/Time   DIAGPAP  10/14/2022 1410    - Negative for intraepithelial lesion or malignancy (NILM)   HPVHIGH Negative 10/14/2022 1410   ADEQPAP  10/14/2022 1410    Satisfactory for evaluation; transformation zone component ABSENT.    Review of Systems Denies any pain or bleeding, right ectopic on US  Reviewed past medical,surgical, social and family history. Reviewed medications and allergies.     Objective:   Physical Exam BP 128/80 (BP Location: Left Arm, Patient Position: Sitting, Cuff Size: Large)   Pulse 97   Ht 5\' 3"  (1.6 m)   Wt 206 lb 8 oz (93.7 kg)   LMP 07/26/2023   BMI 36.58 kg/m     Skin warm and dry.  Lungs: clear to ausculation bilaterally. Cardiovascular: regular rate and rhythm. She is teary now.  Upstream - 09/27/23 1702       Pregnancy Intention Screening   Does the patient want to become pregnant in the next year? N/A    Does the patient's partner want to become pregnant in the next year? N/A    Would the patient like to discuss contraceptive options today? N/A      Contraception Wrap Up   Current Method Pregnant/Seeking Pregnancy    End Method Pregnant/Seeking Pregnancy    Contraception Counseling Provided No              Assessment:     1. Right tubal pregnancy without intrauterine pregnancy (Primary)  +US  found to have right ectopic, with faint FHM, CRL 3.9, with GS 9.3 mm an no YS seen, tube measures 29 x 25 mm. Denies any pain or bleeding at present Called Dr Donetta Furl to inform and sent  secure chat, pt to go to MAU now     Plan:     Follow up TBD

## 2023-09-27 NOTE — Discharge Instructions (Addendum)
 Thank you for allowing us  to take care of you in the MAU today. There is nothing that you did that caused this to happen, and I am so sorry you are going through this.   Come back to the MAU for methotrexate  follow up: -Thursday 09/30/2023 -Sunday 10/03/2023  It is very important that you come to the MAU on those particular days. Coming in the morning can be faster because it is typically less busy.  Methotrexate  can cause side effects like minor nausea or vomiting, abdominal cramps, or spotting. If you develop any severe nausea, vomiting, abdominal pain, vaginal bleeding, come to the MAU sooner.

## 2023-09-30 ENCOUNTER — Inpatient Hospital Stay (HOSPITAL_COMMUNITY)
Admission: AD | Admit: 2023-09-30 | Discharge: 2023-09-30 | Disposition: A | Discharge status: Home / Self Care | Payer: Self-pay | Attending: Obstetrics & Gynecology | Admitting: Obstetrics & Gynecology

## 2023-09-30 DIAGNOSIS — Z3A09 9 weeks gestation of pregnancy: Secondary | ICD-10-CM

## 2023-09-30 DIAGNOSIS — O00101 Right tubal pregnancy without intrauterine pregnancy: Secondary | ICD-10-CM | POA: Diagnosis not present

## 2023-09-30 LAB — HCG, QUANTITATIVE, PREGNANCY: hCG, Beta Chain, Quant, S: 10722 m[IU]/mL — ABNORMAL HIGH (ref <5)

## 2023-09-30 MED ORDER — METHOTREXATE FOR ECTOPIC PREGNANCY
50.0000 mg/m2 | Freq: Once | INTRAMUSCULAR | Status: AC
  Administered 2023-09-30: 100 mg via INTRAMUSCULAR
  Filled 2023-09-30: qty 4

## 2023-09-30 NOTE — MAU Note (Signed)
 Brooke Hunt is a 33 y.o. at [redacted]w[redacted]d here in MAU, pt here for day 4 post Methotrexate .  Here for HCG. Been feeling fine.  Had a little spotting, stopped last night.  No pain.  Has had some loose stools, was told so expected that.   Onset of complaint: ongoing Pain score: none Vitals:   09/30/23 1335  BP: 124/72  Pulse: 81  Resp: 17  Temp: 98.4 F (36.9 C)  SpO2: 100%      Lab orders placed from triage:  HCG

## 2023-09-30 NOTE — MAU Provider Note (Signed)
 History     CSN: [id number]  Arrival date and time: 09/30/23 1256   Event Date/Time   First Provider Initiated Contact with Patient 09/30/2023  1:35 PM   Chief Complaint  Patient presents with   Follow-up    HPI  Brooke Hunt is a 33 y.o. [id number] at [redacted]w[redacted]d who presents to the MAU for repeat labs and hCG. She was given methotrexate  on 5/5 for right ectopic pregnancy, which was seen on dating U/S. hCG at the time was 11,815. Given hx left ectopic pregnancy requiring salpingectomy, pt desired to trial Methotrexate  prior to consideration of left salpingectomy. Given first dose of Methotrexate  on 5/5. She reported a small amount of spotting and diarrhea, but otherwise no ASE. She is here for repeat blood work and second dose of Methotrexate  as discussed at previous MAU visit. No other new complaints or symptoms.  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   Ruptured left tubal ectopic pregnancy causing hemoperitoneum 08/12/2022    Past Surgical History:  Procedure Laterality Date   DIAGNOSTIC LAPAROSCOPY WITH REMOVAL OF ECTOPIC PREGNANCY N/A 09/25/2022   Procedure: Laparoscopic left salpingectomy and removal of ectopic pregnancy;  Surgeon: Julianne Octave, MD;  Location: [facility] OR;  Service: Gynecology;  Laterality: N/A;    Family History  Problem Relation Age of Onset   Healthy Mother    Miscarriages / [place] Mother    Healthy Father    Miscarriages / Stillbirths Sister    Sickle cell trait Son    Arthritis Maternal Grandmother    Dementia Maternal Grandmother    Diabetes Paternal Grandmother     Social History   Tobacco Use   Smoking status: Former    Current packs/day: 0.50    Average packs/day: 0.5 packs/day for 13.0 years (6.5 ttl pk-yrs)    Types: Cigarettes    Passive exposure: Never   Smokeless tobacco: Never  Vaping Use   Vaping status: Never Used  Substance Use Topics   Alcohol  use: Not Currently    Comment: occ; not now   Drug use: Yes    Frequency: 2.0 times per week    Types: Marijuana    Comment: cutting back    Allergies:  Allergies  Allergen Reactions   Shellfish-Derived Products Swelling    Swells nasal passages    No medications prior to admission.    ROS reviewed and pertinent positives and negatives as documented in HPI.  Physical Exam   Blood pressure 132/72, pulse 66, temperature 98.4 F (36.9 C), temperature source Oral, resp. rate 17, height 5\' 3"  (1.6 m), weight 94.2 kg, last menstrual period 07/26/2023, SpO2 100%, not currently breastfeeding.  Physical Exam Constitutional:      General: She is not in acute distress.    Appearance: Normal appearance. She is not ill-appearing.  HENT:     Head: Normocephalic and atraumatic.  Cardiovascular:     Rate and Rhythm: Normal rate.  Pulmonary:     Effort: Pulmonary effort is normal.     Breath sounds: Normal breath sounds.  Abdominal:     Palpations: Abdomen is soft.     Tenderness: There is no abdominal tenderness. There is no guarding.  Musculoskeletal:        General: Normal range of motion.  Skin:    General: Skin is warm and dry.     Findings: No rash.  Neurological:  General: No focal deficit present.     Mental Status: She is alert and oriented to person, place, and time.     MAU Course  Procedures  MDM 33 y.o. [id number] at [redacted]w[redacted]d presenting for repeat blood work and Methotrexate  dose 2 for right ectopic pregnancy. Had hCG of 11,815 and repeat today is [phone]; after discussion of potential treatment options, pt opted for medication management in effort to spare fertility. Pt received first dose on 5/5, no significant ASE. Here for repeat dose and bloodwork. hCG as above. Second dose of Methotrexate  given. Pt has f/up appt for labs on 5/11. She is aware if Methotrexate  x 2 doses is ineffective, surgery would be recommended. Return precautions discussed. Stable for  d/c.  Assessment and Plan     ICD-10-CM   1. Right tubal pregnancy without intrauterine pregnancy  O00.101     Day 1 (09/27/2023): 11,815 - methotrexate  dose 1--> Day 4 (09/30/2023): 82,956 - methotrexate  dose 2  Rh positive Stable for d/c Has upcoming f/up labs on 5/11  Melanie Spires, MD OB Fellow, Faculty [facility]  09/30/2023, 8:48 PM

## 2023-10-03 ENCOUNTER — Encounter (HOSPITAL_COMMUNITY): Payer: Self-pay | Admitting: Obstetrics and Gynecology

## 2023-10-03 ENCOUNTER — Inpatient Hospital Stay (HOSPITAL_COMMUNITY)
Admit: 2023-10-03 | Discharge: 2023-10-03 | Disposition: A | Discharge status: Home / Self Care | Attending: Obstetrics and Gynecology | Admitting: Obstetrics and Gynecology

## 2023-10-03 ENCOUNTER — Inpatient Hospital Stay (HOSPITAL_COMMUNITY)
Admission: AD | Admit: 2023-10-03 | Discharge: 2023-10-03 | Disposition: A | Discharge status: Home / Self Care | Payer: Self-pay | Attending: Obstetrics and Gynecology | Admitting: Obstetrics and Gynecology

## 2023-10-03 DIAGNOSIS — O00101 Right tubal pregnancy without intrauterine pregnancy: Secondary | ICD-10-CM | POA: Diagnosis not present

## 2023-10-03 DIAGNOSIS — Z3A09 9 weeks gestation of pregnancy: Secondary | ICD-10-CM

## 2023-10-03 DIAGNOSIS — R102 Pelvic and perineal pain: Secondary | ICD-10-CM | POA: Diagnosis present

## 2023-10-03 DIAGNOSIS — R197 Diarrhea, unspecified: Secondary | ICD-10-CM | POA: Diagnosis present

## 2023-10-03 LAB — CBC
HCT: 35.7 % — ABNORMAL LOW (ref 36.0–46.0)
Hemoglobin: 11.5 g/dL — ABNORMAL LOW (ref 12.0–15.0)
MCH: 26.7 pg (ref 26.0–34.0)
MCHC: 32.2 g/dL (ref 30.0–36.0)
MCV: 83 fL (ref 80.0–100.0)
Platelets: 358 10*3/uL (ref 150–400)
RBC: 4.3 MIL/uL (ref 3.87–5.11)
RDW: 15.4 % (ref 11.5–15.5)
WBC: 8.3 10*3/uL (ref 4.0–10.5)
nRBC: 0 % (ref 0.0–0.2)

## 2023-10-03 LAB — COMPREHENSIVE METABOLIC PANEL WITH GFR
ALT: 20 U/L (ref 0–44)
AST: 17 U/L (ref 15–41)
Albumin: 3.9 g/dL (ref 3.5–5.0)
Alkaline Phosphatase: 50 U/L (ref 38–126)
Anion gap: 10 (ref 5–15)
BUN: 7 mg/dL (ref 6–20)
CO2: 23 mmol/L (ref 22–32)
Calcium: 9.1 mg/dL (ref 8.9–10.3)
Chloride: 106 mmol/L (ref 98–111)
Creatinine, Ser: 0.74 mg/dL (ref 0.44–1.00)
GFR, Estimated: >60 mL/min (ref >60)
Glucose, Bld: 106 mg/dL — ABNORMAL HIGH (ref 70–99)
Potassium: 3.9 mmol/L (ref 3.5–5.1)
Sodium: 139 mmol/L (ref 135–145)
Total Bilirubin: 0.6 mg/dL (ref 0.0–1.2)
Total Protein: 7 g/dL (ref 6.5–8.1)

## 2023-10-03 LAB — HCG, QUANTITATIVE, PREGNANCY: hCG, Beta Chain, Quant, S: 8840 m[IU]/mL — ABNORMAL HIGH (ref <5)

## 2023-10-03 NOTE — MAU Note (Signed)
 Brooke Hunt is a 33 y.o. at [redacted]w[redacted]d here in MAU reporting: today is day 7 post dose 1 of Methotrexate .  Denies any bleeding.  Started having cramping yesterday morning, then started having diarrhea. Said pain would ease some after using the bathroom.   Diarrhea and cramping continue.  Onset of complaint: ongoing following Pain score: moderate Vitals:   10/03/23 1050  BP: 128/82  Pulse: 94  Resp: 17  Temp: 98.8 F (37.1 C)  SpO2: 100%      Lab orders placed from triage:  hcg drawn while in triage

## 2023-10-03 NOTE — MAU Provider Note (Signed)
 Chief Complaint: Follow-up  SUBJECTIVE HPI: Brooke Hunt is a 33 y.o. [id number] at [redacted]w[redacted]d by LMP who presents to maternity admissions reporting need for repeat HCG.  Patient presents for repeat hCG.  Is now 7 days s/p initial methotrexate  treatment for known right tubal ectopic pregnancy.  Additionally received second dose on day 4 given high quant and visible ectopic.  Patient has had left tube removed secondary to ectopic pregnancy in the past and desires to continue fertility.  She has mild cramping abdominal pain intermittently but no pain.  Does have some diarrhea after second dose.  Overall feels well.  HPI  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   Ruptured left tubal ectopic pregnancy causing hemoperitoneum 08/12/2022   Past Surgical History:  Procedure Laterality Date   DIAGNOSTIC LAPAROSCOPY WITH REMOVAL OF ECTOPIC PREGNANCY N/A 09/25/2022   Procedure: Laparoscopic left salpingectomy and removal of ectopic pregnancy;  Surgeon: Julianne Octave, MD;  Location: [facility] OR;  Service: Gynecology;  Laterality: N/A;   Social History   Socioeconomic History   Marital status: Single    Spouse name: Not on file   Number of children: Not on file   Years of education: Not on file   Highest education level: Not on file  Occupational History   Not on file  Tobacco Use   Smoking status: Former    Current packs/day: 0.50    Average packs/day: 0.5 packs/day for 13.0 years (6.5 ttl pk-yrs)    Types: Cigarettes    Passive exposure: Never   Smokeless tobacco: Never  Vaping Use   Vaping status: Never Used  Substance and Sexual Activity   Alcohol use: Not Currently    Comment: occ; not now   Drug use: Yes    Frequency: 2.0 times per week    Types: Marijuana    Comment: cutting back   Sexual activity: Yes    Birth control/protection: None  Other Topics Concern   Not on file  Social History Narrative    Not on file   Social Drivers of Health   Financial Resource Strain: Not on file  Food Insecurity: Not on file  Transportation Needs: Not on file  Physical Activity: Not on file  Stress: Not on file  Social Connections: Not on file  Intimate Partner Violence: Not on file   No current facility-administered medications on file prior to encounter.   No current outpatient medications on file prior to encounter.   Allergies  Allergen Reactions   Shellfish-Derived Products Swelling    Swells nasal passages    ROS:  Pertinent positives/negatives listed above.  I have reviewed patient's Past Medical Hx, Surgical Hx, Family Hx, Social Hx, medications and allergies.   Physical Exam  Patient Vitals for the past 24 hrs:  BP Temp Temp src Pulse Resp SpO2  10/03/23 1050 128/82 98.8 F (37.1 C) Oral 94 17 100 %   Constitutional: Well-developed, well-nourished female in no acute distress Cardiovascular: normal rate Respiratory: normal effort GI: Abd soft, non-tender MS: Extremities nontender, no edema, normal ROM Neurologic: Alert and oriented x 4.  GU: Neg CVAT  LAB RESULTS Results for orders placed or performed during the hospital encounter of 10/03/23 (from the past 24 hours)  hCG, quantitative, pregnancy     Status: Abnormal   Collection Time: 10/03/23 10:45 AM  Result Value Ref Range   hCG, Beta Chain, Quant,  S 8,840 (H) <5 mIU/mL  CBC     Status: Abnormal   Collection Time: 10/03/23 10:45 AM  Result Value Ref Range   WBC 8.3 4.0 - 10.5 K/uL   RBC 4.30 3.87 - 5.11 MIL/uL   Hemoglobin 11.5 (L) 12.0 - 15.0 g/dL   HCT 16.1 (L) 09.6 - 04.5 %   MCV 83.0 80.0 - 100.0 fL   MCH 26.7 26.0 - 34.0 pg   MCHC 32.2 30.0 - 36.0 g/dL   RDW 40.9 81.1 - 91.4 %   Platelets 358 150 - 400 K/uL   nRBC 0.0 0.0 - 0.2 %  Comprehensive metabolic panel     Status: Abnormal   Collection Time: 10/03/23 10:45 AM  Result Value Ref Range   Sodium 139 135 - 145 mmol/L   Potassium 3.9 3.5 - 5.1  mmol/L   Chloride 106 98 - 111 mmol/L   CO2 23 22 - 32 mmol/L   Glucose, Bld 106 (H) 70 - 99 mg/dL   BUN 7 6 - 20 mg/dL   Creatinine, Ser 7.82 0.44 - 1.00 mg/dL   Calcium 9.1 8.9 - 95.6 mg/dL   Total Protein 7.0 6.5 - 8.1 g/dL   Albumin  3.9 3.5 - 5.0 g/dL   AST 17 15 - 41 U/L   ALT 20 0 - 44 U/L   Alkaline Phosphatase 50 38 - 126 U/L   Total Bilirubin 0.6 0.0 - 1.2 mg/dL   GFR, Estimated >21 >30 mL/min   Anion gap 10 5 - 15       IMAGING US  OB Transvaginal Result Date: 09/29/2023 Table formatting from the original result was not included. Images from the original result were not included.  ..an Financial trader of Ultrasound Medicine Technical sales engineer) accredited practice Center for [facility] [address] [place] [address] Ordering Provider: Ozan, Jennifer, [place] DATING AND VIABILITY SONOGRAM Brooke Hunt is a 33 y.o. year old [id number] with LMP Patient's last menstrual period was 07/26/2023. which would correlate to  [redacted]w[redacted]d weeks gestation.  She has regular menstrual cycles.   She is here today for a confirmatory initial sonogram.  HISTORY OF 2 PREVIOUS ECTOPICS - LEFT SALPINGECTOMY MAY 2024 GESTATION: No evidence of intrauterine pregnancy - em thickness = 12.1 mm CERVIX: Appears closed ADNEXA: The ovaries are normal.  neg Left adnexal region Normal Right ovary with Live Right ectopic seen medial and adjacent to the Right ovary. Size of entire tubal pregnancy - 29 x 25 x 23 mm  size of gestational sac = 9.3 mm = 5+5 weeks  CRL = 3.9 mm = 6+1 weeks slow faint heart motion seen.  (See cine clips) Tiny amount of clear free fluid within CDS, no free fluid seen elsewhere. GESTATIONAL AGE AND  BIOMETRICS: Gestational criteria: Estimated Date of Delivery: 05/01/24 by LMP now at [redacted]w[redacted]d Previous Scans:0 GESTATIONAL SAC           9.3 mm         5+5 weeks CROWN RUMP LENGTH           3.9 mm         6+1 weeks  AVERAGE EGA(BY  THIS SCAN):  6+1 week Right ectopic with slow faint heart motion                                                  Size of entire tubal pregnancy - 29 x 25 x 23 mm  TECHNICIAN COMMENTS: US : GA by LMP = 9+0 weeks Anteverted uterus normal in size, endometrial thickness = 12.1 mm, no evidence of IUP Normal Left ovary - neg Left adnexal region Normal Right ovary with Live Right ectopic seen medial and adjacent to the Right ovary. Size of entire tubal pregnancy - 29 x 25 x 23 mm  size of gestational sac = 9.3 mm = 5+5 weeks  CRL = 3.9 mm = 6+1 weeks slow faint heart motion seen.  (See cine clips) Tiny amount of clear free fluid within CDS, no free fluid seen elsewhere. A copy of this report including all images has been saved and backed up to a second source for retrieval if needed. All measures and details of the anatomical scan, placentation, fluid volume and pelvic anatomy are contained in that report. Brien Can 09/27/2023 5:27 PM IUP not visualized, thickened endometrium noted Right adnexa- ectopic pregnancy noted with cardiac activity measuring 2.9 x 2.5 x 2.3 cm Normal left ovary Very small free fluid noted with CDS On-call provider notified and patient sent to hospital for further evaluation and management Jennifer Ozan, DO Attending Obstetrician & Gynecologist, Faculty Practice Center for [facility], [facility]   US  OB Comp Less 14 Wks Result Date: 09/29/2023 Table formatting from the original result was not included. Images from the original result were not included.  ..an Financial trader of Ultrasound Medicine Technical sales engineer) accredited practice Center for [facility] [address] [place] [address] Ordering Provider: Ozan, Jennifer, [place] DATING AND VIABILITY SONOGRAM JENAVI SCHLOTFELDT is a 33 y.o. year old [id number] with LMP Patient's last menstrual period was 07/26/2023. which would correlate to  [redacted]w[redacted]d weeks gestation.  She has regular menstrual cycles.   She is here  today for a confirmatory initial sonogram.  HISTORY OF 2 PREVIOUS ECTOPICS - LEFT SALPINGECTOMY MAY 2024 GESTATION: No evidence of intrauterine pregnancy - em thickness = 12.1 mm CERVIX: Appears closed ADNEXA: The ovaries are normal.  neg Left adnexal region Normal Right ovary with Live Right ectopic seen medial and adjacent to the Right ovary. Size of entire tubal pregnancy - 29 x 25 x 23 mm  size of gestational sac = 9.3 mm = 5+5 weeks  CRL = 3.9 mm = 6+1 weeks slow faint heart motion seen.  (See cine clips) Tiny amount of clear free fluid within CDS, no free fluid seen elsewhere. GESTATIONAL AGE AND  BIOMETRICS: Gestational criteria: Estimated Date of Delivery: 05/01/24 by LMP now at [redacted]w[redacted]d Previous Scans:0 GESTATIONAL SAC           9.3 mm         5+5 weeks CROWN RUMP LENGTH           3.9 mm         6+1 weeks  AVERAGE EGA(BY THIS SCAN):  6+1 week Right ectopic with slow faint heart motion                                                  Size of entire tubal pregnancy - 29 x 25 x 23 mm  TECHNICIAN COMMENTS: US : GA by LMP = 9+0 weeks Anteverted uterus normal in size, endometrial thickness = 12.1 mm, no evidence of IUP Normal Left ovary - neg Left adnexal region Normal Right ovary with Live Right ectopic seen medial and adjacent to the Right ovary. Size of entire tubal pregnancy - 29 x 25 x 23 mm  size of gestational sac = 9.3 mm = 5+5 weeks  CRL = 3.9 mm = 6+1 weeks slow faint heart motion seen.  (See cine clips) Tiny amount of clear free fluid within CDS, no free fluid seen elsewhere. A copy of this report including all images has been saved and backed up to a second source for retrieval if needed. All measures and details of the anatomical scan, placentation, fluid volume and pelvic anatomy are contained in that report. Brien Can 09/27/2023 5:27 PM IUP not visualized, thickened endometrium noted Right adnexa- ectopic pregnancy noted with cardiac  activity measuring 2.9 x 2.5 x 2.3 cm Normal left ovary Very small free fluid noted with CDS On-call provider notified and patient sent to hospital for further evaluation and management Jennifer Ozan, DO Attending Obstetrician & Gynecologist, Faculty Practice Center for [facility], [facility]    MAU Management/MDM: Orders Placed This Encounter  Procedures   hCG, quantitative, pregnancy   CBC   Comprehensive metabolic panel   Discharge patient    No orders of the defined types were placed in this encounter.   Patient presents to MAU on day #7 from 2 dose methotrexate  series for known right tubal ectopic pregnancy.  At this time her hemoglobin is stable, CMP is within normal limits and hCG has downtrended more than 15%.  Additionally she is stable and overall feeling well.  Discussed that this is reassuring findings all-around.  Will follow-up [facility] week for repeat hCG and reevaluation.  Discussed return precautions for ruptured ectopic and that she should immediately return to MAU if any of those symptoms develop.  Additionally discussed that diarrhea is a common side effect from receiving high doses of methotrexate  and will continue to improve.  ASSESSMENT 1. Right tubal pregnancy without intrauterine pregnancy     PLAN Discharge home with strict return precautions. Allergies as of 10/03/2023       Reactions   Shellfish-derived Products Swelling   Swells nasal passages        Medication List    You have not been prescribed any medications.      Authur Leghorn, MD OB Fellow 10/03/2023  12:10 PM

## 2023-10-04 DIAGNOSIS — Z419 Encounter for procedure for purposes other than remedying health state, unspecified: Secondary | ICD-10-CM | POA: Diagnosis not present

## 2023-10-11 ENCOUNTER — Other Ambulatory Visit

## 2023-10-11 DIAGNOSIS — O00101 Right tubal pregnancy without intrauterine pregnancy: Secondary | ICD-10-CM

## 2023-10-14 ENCOUNTER — Inpatient Hospital Stay (HOSPITAL_COMMUNITY)

## 2023-10-14 ENCOUNTER — Inpatient Hospital Stay (HOSPITAL_COMMUNITY)
Admission: EM | Admit: 2023-10-14 | Discharge: 2023-10-14 | Disposition: A | Discharge status: Home / Self Care | Attending: Obstetrics and Gynecology | Admitting: Obstetrics and Gynecology

## 2023-10-14 ENCOUNTER — Other Ambulatory Visit: Payer: Self-pay

## 2023-10-14 ENCOUNTER — Inpatient Hospital Stay (EMERGENCY_DEPARTMENT_HOSPITAL)

## 2023-10-14 ENCOUNTER — Emergency Department (HOSPITAL_COMMUNITY)

## 2023-10-14 ENCOUNTER — Encounter (HOSPITAL_COMMUNITY)
Admission: EM | Disposition: A | Discharge status: Home / Self Care | Payer: Self-pay | Source: Home / Self Care | Attending: Emergency Medicine

## 2023-10-14 ENCOUNTER — Encounter (HOSPITAL_COMMUNITY): Payer: Self-pay | Admitting: Emergency Medicine

## 2023-10-14 DIAGNOSIS — O009 Unspecified ectopic pregnancy without intrauterine pregnancy: Secondary | ICD-10-CM | POA: Insufficient documentation

## 2023-10-14 DIAGNOSIS — R231 Pallor: Secondary | ICD-10-CM | POA: Diagnosis not present

## 2023-10-14 DIAGNOSIS — Z3A11 11 weeks gestation of pregnancy: Secondary | ICD-10-CM

## 2023-10-14 DIAGNOSIS — Z7401 Bed confinement status: Secondary | ICD-10-CM | POA: Diagnosis not present

## 2023-10-14 DIAGNOSIS — O00101 Right tubal pregnancy without intrauterine pregnancy: Secondary | ICD-10-CM | POA: Diagnosis not present

## 2023-10-14 DIAGNOSIS — O3680X0 Pregnancy with inconclusive fetal viability, not applicable or unspecified: Secondary | ICD-10-CM | POA: Diagnosis not present

## 2023-10-14 DIAGNOSIS — Z87891 Personal history of nicotine dependence: Secondary | ICD-10-CM | POA: Diagnosis not present

## 2023-10-14 DIAGNOSIS — R1031 Right lower quadrant pain: Secondary | ICD-10-CM | POA: Diagnosis not present

## 2023-10-14 DIAGNOSIS — Z3A00 Weeks of gestation of pregnancy not specified: Secondary | ICD-10-CM | POA: Diagnosis not present

## 2023-10-14 DIAGNOSIS — Z7689 Persons encountering health services in other specified circumstances: Secondary | ICD-10-CM | POA: Diagnosis not present

## 2023-10-14 DIAGNOSIS — O26891 Other specified pregnancy related conditions, first trimester: Secondary | ICD-10-CM | POA: Diagnosis not present

## 2023-10-14 DIAGNOSIS — I959 Hypotension, unspecified: Secondary | ICD-10-CM | POA: Diagnosis not present

## 2023-10-14 HISTORY — PX: DIAGNOSTIC LAPAROSCOPY WITH REMOVAL OF ECTOPIC PREGNANCY: SHX6449

## 2023-10-14 LAB — COMPREHENSIVE METABOLIC PANEL WITH GFR
ALT: 17 U/L (ref 0–44)
AST: 17 U/L (ref 15–41)
Albumin: 4.2 g/dL (ref 3.5–5.0)
Alkaline Phosphatase: 70 U/L (ref 38–126)
Anion gap: 6 (ref 5–15)
BUN: 11 mg/dL (ref 6–20)
CO2: 23 mmol/L (ref 22–32)
Calcium: 8.6 mg/dL — ABNORMAL LOW (ref 8.9–10.3)
Chloride: 103 mmol/L (ref 98–111)
Creatinine, Ser: 0.55 mg/dL (ref 0.44–1.00)
GFR, Estimated: >60 mL/min (ref >60)
Glucose, Bld: 149 mg/dL — ABNORMAL HIGH (ref 70–99)
Potassium: 3.5 mmol/L (ref 3.5–5.1)
Sodium: 132 mmol/L — ABNORMAL LOW (ref 135–145)
Total Bilirubin: 0.5 mg/dL (ref 0.0–1.2)
Total Protein: 7.7 g/dL (ref 6.5–8.1)

## 2023-10-14 LAB — TYPE AND SCREEN
ABO/RH(D): O POS
ABO/RH(D): O POS
Antibody Screen: NEGATIVE
Antibody Screen: NEGATIVE

## 2023-10-14 LAB — CBC
HCT: 36.1 % (ref 36.0–46.0)
Hemoglobin: 12.2 g/dL (ref 12.0–15.0)
MCH: 28.1 pg (ref 26.0–34.0)
MCHC: 33.8 g/dL (ref 30.0–36.0)
MCV: 83.2 fL (ref 80.0–100.0)
Platelets: 401 10*3/uL — ABNORMAL HIGH (ref 150–400)
RBC: 4.34 MIL/uL (ref 3.87–5.11)
RDW: 15.9 % — ABNORMAL HIGH (ref 11.5–15.5)
WBC: 9.8 10*3/uL (ref 4.0–10.5)
nRBC: 0 % (ref 0.0–0.2)

## 2023-10-14 LAB — LIPASE, BLOOD: Lipase: 24 U/L (ref 11–51)

## 2023-10-14 LAB — HCG, QUANTITATIVE, PREGNANCY: hCG, Beta Chain, Quant, S: 2347 m[IU]/mL — ABNORMAL HIGH (ref <5)

## 2023-10-14 SURGERY — LAPAROSCOPY, WITH ECTOPIC PREGNANCY SURGICAL TREATMENT
Anesthesia: General | Site: Pelvis

## 2023-10-14 MED ORDER — HYDROMORPHONE HCL 1 MG/ML IJ SOLN
0.2500 mg | INTRAMUSCULAR | Status: DC | PRN
Start: 2023-10-14 — End: 2023-10-14
  Administered 2023-10-14 (×2): 0.5 mg via INTRAVENOUS

## 2023-10-14 MED ORDER — SUCCINYLCHOLINE CHLORIDE 200 MG/10ML IV SOSY
PREFILLED_SYRINGE | INTRAVENOUS | Status: DC | PRN
  Administered 2023-10-14: 100 mg via INTRAVENOUS

## 2023-10-14 MED ORDER — ONDANSETRON HCL 4 MG/2ML IJ SOLN
4.0000 mg | Freq: Once | INTRAMUSCULAR | Status: AC
Start: 2023-10-14 — End: 2023-10-14
  Administered 2023-10-14: 4 mg via INTRAVENOUS
  Filled 2023-10-14: qty 2

## 2023-10-14 MED ORDER — TRANEXAMIC ACID-NACL 1000-0.7 MG/100ML-% IV SOLN
INTRAVENOUS | Status: AC
  Filled 2023-10-14: qty 100

## 2023-10-14 MED ORDER — OXYCODONE HCL 5 MG/5ML PO SOLN: 5.0000 mg | Freq: Once | ORAL | Status: DC | PRN

## 2023-10-14 MED ORDER — ORAL CARE MOUTH RINSE: 15.0000 mL | Freq: Once | OROMUCOSAL | Status: AC

## 2023-10-14 MED ORDER — SUGAMMADEX SODIUM 200 MG/2ML IV SOLN
INTRAVENOUS | Status: DC | PRN
Start: 2023-10-14 — End: 2023-10-14
  Administered 2023-10-14: 200 mg via INTRAVENOUS

## 2023-10-14 MED ORDER — FENTANYL CITRATE (PF) 250 MCG/5ML IJ SOLN
INTRAMUSCULAR | Status: AC
  Filled 2023-10-14: qty 5

## 2023-10-14 MED ORDER — 0.9 % SODIUM CHLORIDE (POUR BTL) OPTIME
TOPICAL | Status: DC | PRN
  Administered 2023-10-14: 1000 mL

## 2023-10-14 MED ORDER — SERTRALINE HCL 50 MG PO TABS: 50.0000 mg | ORAL_TABLET | Freq: Every day | ORAL | 1 refills | Status: AC

## 2023-10-14 MED ORDER — FENTANYL CITRATE (PF) 100 MCG/2ML IJ SOLN
50.0000 ug | INTRAMUSCULAR | Status: DC | PRN
  Administered 2023-10-14: 50 ug via INTRAVENOUS
  Administered 2023-10-14: 100 ug via INTRAVENOUS
  Administered 2023-10-14: 50 ug via INTRAVENOUS
  Administered 2023-10-14: 100 ug via INTRAVENOUS
  Filled 2023-10-14: qty 2

## 2023-10-14 MED ORDER — FENTANYL CITRATE (PF) 100 MCG/2ML IJ SOLN
100.0000 ug | Freq: Once | INTRAMUSCULAR | Status: AC
  Administered 2023-10-14: 100 ug via INTRAVENOUS
  Filled 2023-10-14: qty 2

## 2023-10-14 MED ORDER — PROPOFOL 10 MG/ML IV BOLUS
INTRAVENOUS | Status: AC
  Filled 2023-10-14: qty 20

## 2023-10-14 MED ORDER — SUCCINYLCHOLINE CHLORIDE 200 MG/10ML IV SOSY
PREFILLED_SYRINGE | INTRAVENOUS | Status: AC
  Filled 2023-10-14: qty 10

## 2023-10-14 MED ORDER — ONDANSETRON HCL 4 MG/2ML IJ SOLN
INTRAMUSCULAR | Status: AC
  Filled 2023-10-14: qty 2

## 2023-10-14 MED ORDER — MIDAZOLAM HCL 2 MG/2ML IJ SOLN
INTRAMUSCULAR | Status: AC
  Filled 2023-10-14: qty 2

## 2023-10-14 MED ORDER — ONDANSETRON HCL 4 MG/2ML IJ SOLN
INTRAMUSCULAR | Status: DC | PRN
  Administered 2023-10-14: 4 mg via INTRAVENOUS

## 2023-10-14 MED ORDER — OXYCODONE HCL 5 MG PO TABS: 5.0000 mg | ORAL_TABLET | Freq: Once | ORAL | Status: DC | PRN

## 2023-10-14 MED ORDER — AMISULPRIDE (ANTIEMETIC) 5 MG/2ML IV SOLN: 10.0000 mg | Freq: Once | INTRAVENOUS | Status: DC | PRN

## 2023-10-14 MED ORDER — HYDROMORPHONE HCL 1 MG/ML IJ SOLN
INTRAMUSCULAR | Status: AC
  Filled 2023-10-14: qty 1

## 2023-10-14 MED ORDER — MEPERIDINE HCL 25 MG/ML IJ SOLN
6.2500 mg | INTRAMUSCULAR | Status: DC | PRN
Start: 2023-10-14 — End: 2023-10-14

## 2023-10-14 MED ORDER — LIDOCAINE 2% (20 MG/ML) 5 ML SYRINGE
INTRAMUSCULAR | Status: DC | PRN
  Administered 2023-10-14: 100 mg via INTRAVENOUS

## 2023-10-14 MED ORDER — CHLORHEXIDINE GLUCONATE 0.12 % MT SOLN
15.0000 mL | Freq: Once | OROMUCOSAL | Status: AC
  Administered 2023-10-14: 15 mL via OROMUCOSAL
  Filled 2023-10-14: qty 15

## 2023-10-14 MED ORDER — ROCURONIUM BROMIDE 10 MG/ML (PF) SYRINGE
PREFILLED_SYRINGE | INTRAVENOUS | Status: AC
  Filled 2023-10-14: qty 10

## 2023-10-14 MED ORDER — DEXAMETHASONE SODIUM PHOSPHATE 10 MG/ML IJ SOLN
INTRAMUSCULAR | Status: AC
  Filled 2023-10-14: qty 1

## 2023-10-14 MED ORDER — BUPIVACAINE HCL (PF) 0.25 % IJ SOLN
INTRAMUSCULAR | Status: AC
  Filled 2023-10-14: qty 30

## 2023-10-14 MED ORDER — DEXAMETHASONE SODIUM PHOSPHATE 10 MG/ML IJ SOLN
INTRAMUSCULAR | Status: DC | PRN
  Administered 2023-10-14: 10 mg via INTRAVENOUS

## 2023-10-14 MED ORDER — OXYCODONE-ACETAMINOPHEN 5-325 MG PO TABS: 1.0000 | ORAL_TABLET | Freq: Four times a day (QID) | ORAL | 0 refills | Status: AC | PRN

## 2023-10-14 MED ORDER — BUPIVACAINE HCL (PF) 0.25 % IJ SOLN
INTRAMUSCULAR | Status: DC | PRN
  Administered 2023-10-14: 30 mL

## 2023-10-14 MED ORDER — MIDAZOLAM HCL 2 MG/2ML IJ SOLN
INTRAMUSCULAR | Status: DC | PRN
  Administered 2023-10-14: 2 mg via INTRAVENOUS

## 2023-10-14 MED ORDER — LIDOCAINE 2% (20 MG/ML) 5 ML SYRINGE
INTRAMUSCULAR | Status: AC
  Filled 2023-10-14: qty 5

## 2023-10-14 MED ORDER — SODIUM CHLORIDE 0.9 % IV SOLN: 12.5000 mg | INTRAVENOUS | Status: DC | PRN

## 2023-10-14 MED ORDER — ROCURONIUM BROMIDE 10 MG/ML (PF) SYRINGE
PREFILLED_SYRINGE | INTRAVENOUS | Status: DC | PRN
  Administered 2023-10-14: 10 mg via INTRAVENOUS
  Administered 2023-10-14: 50 mg via INTRAVENOUS

## 2023-10-14 MED ORDER — LACTATED RINGERS IV SOLN: INTRAVENOUS | Status: DC

## 2023-10-14 MED ORDER — PROPOFOL 10 MG/ML IV BOLUS
INTRAVENOUS | Status: DC | PRN
  Administered 2023-10-14: 170 mg via INTRAVENOUS

## 2023-10-14 MED ORDER — IBUPROFEN 600 MG PO TABS: 600.0000 mg | ORAL_TABLET | Freq: Four times a day (QID) | ORAL | 3 refills | Status: AC | PRN

## 2023-10-14 SURGICAL SUPPLY — 33 items
APPLICATOR COTTON TIP 6 STRL (MISCELLANEOUS) IMPLANT
APPLICATOR COTTON TIP 6IN STRL (MISCELLANEOUS) ×1 IMPLANT
CHLORAPREP W/TINT 26 (MISCELLANEOUS) IMPLANT
DERMABOND ADVANCED .7 DNX12 (GAUZE/BANDAGES/DRESSINGS) ×1 IMPLANT
DRAPE SURG IRRIG POUCH 19X23 (DRAPES) ×1 IMPLANT
GLOVE BIO SURGEON STRL SZ 6 (GLOVE) ×1 IMPLANT
GLOVE BIOGEL PI IND STRL 6 (GLOVE) IMPLANT
GLOVE BIOGEL PI IND STRL 6.5 (GLOVE) IMPLANT
GLOVE BIOGEL PI IND STRL 7.0 (GLOVE) ×2 IMPLANT
GLOVE SURG SS PI 6.5 STRL IVOR (GLOVE) IMPLANT
GOWN STRL REUS W/ TWL LRG LVL3 (GOWN DISPOSABLE) ×2 IMPLANT
HIBICLENS CHG 4% 4OZ (MISCELLANEOUS) IMPLANT
IRRIGATION SUCT STRKRFLW 2 WTP (MISCELLANEOUS) IMPLANT
IV NS 1000ML BAXH (IV SOLUTION) IMPLANT
KIT PINK PAD W/HEAD ARE REST (MISCELLANEOUS) ×1 IMPLANT
KIT PINK PAD W/HEAD ARM REST (MISCELLANEOUS) ×1 IMPLANT
KIT TURNOVER KIT B (KITS) ×1 IMPLANT
NDL INSUFFLATION 14GA 120MM (NEEDLE) ×1 IMPLANT
NEEDLE INSUFFLATION 14GA 120MM (NEEDLE) ×1 IMPLANT
PACK LAPAROSCOPY BASIN (CUSTOM PROCEDURE TRAY) ×1 IMPLANT
POUCH LAPAROSCOPIC INSTRUMENT (MISCELLANEOUS) ×1 IMPLANT
SHEARS HARMONIC 36 ACE (MISCELLANEOUS) IMPLANT
SLEEVE ADV FIXATION 5X100MM (TROCAR) ×1 IMPLANT
SPIKE FLUID TRANSFER (MISCELLANEOUS) IMPLANT
SUT VIC AB 4-0 PS2 18 (SUTURE) ×1 IMPLANT
SUT VICRYL 0 UR6 27IN ABS (SUTURE) ×1 IMPLANT
SYSTEM BAG RETRIEVAL 10MM (BASKET) IMPLANT
TOWEL GREEN STERILE FF (TOWEL DISPOSABLE) ×2 IMPLANT
TRAY FOLEY W/BAG SLVR 14FR (SET/KITS/TRAYS/PACK) ×1 IMPLANT
TROCAR 11X100 Z THREAD (TROCAR) ×1 IMPLANT
TROCAR ADV FIXATION 5X100MM (TROCAR) ×1 IMPLANT
TROCAR BALLN 12MMX100 BLUNT (TROCAR) IMPLANT
WARMER LAPAROSCOPE (MISCELLANEOUS) ×1 IMPLANT

## 2023-10-14 NOTE — Discharge Instructions (Signed)

## 2023-10-14 NOTE — MAU Note (Signed)
 Brooke Hunt is a 33 y.o. at [redacted]w[redacted]d here in MAU reporting: [facility].  Ruptured ectopic pregnancy, had received 2 doses of Methotrexate  for treatment on 5/5 and 5/8.  HCG levels had been dropping.  0230 this morning had sudden onset of sharp pain in RLQ. Bleeding stopped a couple days ago. Pt is alert and oriented. Dr Vallarie Gauze was in to consent pt for surgery.   Onset of complaint: 0230 Pain score: 11 Vitals:   10/14/23 0600 10/14/23 0630  BP: 123/75   Pulse: 67 91  Resp: 16 11  Temp:    SpO2: 96% 98%      Lab orders placed from triage:  stat call to [facility] for T&C  Pt has 20gu saline lock in left AC and 18gu in rt AC.  Second call to [facility] for stat draw

## 2023-10-14 NOTE — H&P (Signed)
 [facility] Gynecology Attending History and Physical  Brooke Hunt is a 33 y.o. [id number] who presented to [facility] today for evaluation of acute onset right sided abdominal pain. She was diagnosed with a right ectopic pregnancy on 5/5 and was given methotrexate . The US  at that time showed embryo with cardiac activity however the patient desired methotrexate  due to having already had salpingectomy. She had a total of two doses of methotrexate  and showed an appropriate decline in her HCG. Rates her pain an 11/10. Pain is on her right side.   She denied any VB. Pain was worse with movement.    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   Ruptured left tubal ectopic pregnancy causing hemoperitoneum 08/12/2022   Past Surgical History:  Procedure Laterality Date   DIAGNOSTIC LAPAROSCOPY WITH REMOVAL OF ECTOPIC PREGNANCY N/A 09/25/2022   Procedure: Laparoscopic left salpingectomy and removal of ectopic pregnancy;  Surgeon: Julianne Octave, MD;  Location: [facility];  Service: Gynecology;  Laterality: N/A;   OB History  Gravida Para Term Preterm AB Living  5 3 3  1 2   SAB IAB Ectopic Multiple Live Births    1 0 2    # Outcome Date GA Lbr Len/2nd Weight Sex Type Anes PTL Lv  5 Current           4 Term 03/16/23     ECTOPIC     3 Ectopic 09/25/22     ECTOPIC     2 Term 06/15/15 [redacted]w[redacted]d   F Vag-Spont  N LIV  1 Term 06/17/14 [redacted]w[redacted]d 15:35 / 00:12 3075 g M Vag-Spont None N LIV     Birth Comments: Neg RPR, HIV, Hep B + UDS for THC with hx of maternal use throughout pregnancy + maternal chlamydia in pregnancy but neg PTD  HB FAS  Patient denies any other pertinent gynecologic issues.  No current facility-administered medications on file prior to encounter.   No current outpatient medications on file prior to encounter.   Allergies  Allergen Reactions   Shellfish-Derived Products Swelling    Swells  nasal passages    Social History:   reports that she has quit smoking. Her smoking use included cigarettes. She has a 6.5 pack-year smoking history. She has never been exposed to tobacco smoke. She has never used smokeless tobacco. She reports that she does not currently use alcohol. She reports current drug use. Frequency: 2.00 times per week. Drug: Marijuana. Family History  Problem Relation Age of Onset   Healthy Mother    Miscarriages / [place] Mother    Healthy Father    Miscarriages / Stillbirths Sister    Sickle cell trait Son    Arthritis Maternal Grandmother    Dementia Maternal Grandmother    Diabetes Paternal Grandmother     Review of Systems: Pertinent items noted in HPI and remainder of comprehensive ROS otherwise negative.  PHYSICAL EXAM: Blood pressure 123/75, pulse 67, temperature 98 F (36.7 C), temperature source Oral, resp. rate 16, height 5\' 3"  (1.6 m), weight 93 kg, last menstrual period 07/26/2023, SpO2 96%, not currently breastfeeding. CONSTITUTIONAL: Well-developed, well-nourished female in no acute distress.  HENT:  Normocephalic, atraumatic, External right and left ear normal. Oropharynx is clear and moist EYES: Conjunctivae and EOM are normal. Pupils are equal, round, and reactive to light. No scleral icterus.  NECK: Normal range of motion, supple, no  masses SKIN: Skin is warm and dry. No rash noted. Not diaphoretic. No erythema. No pallor. NEUROLOGIC: Alert and oriented to person, place, and time. Normal reflexes, muscle tone coordination. No cranial nerve deficit noted. PSYCHIATRIC: Normal mood and affect. Normal behavior. Normal judgment and thought content. CARDIOVASCULAR: Normal heart rate noted RESPIRATORY: Effort normal PELVIC: Not examined MUSCULOSKELETAL: Normal range of motion. No tenderness.  No cyanosis, clubbing, or edema.  2+ distal pulses.  Labs: Results for orders placed or performed during the hospital encounter of 10/14/23 (from the  past 2 weeks)  Lipase, blood   Collection Time: 10/14/23  3:24 AM  Result Value Ref Range   Lipase 24 11 - 51 U/L  Comprehensive metabolic panel   Collection Time: 10/14/23  3:24 AM  Result Value Ref Range   Sodium 132 (L) 135 - 145 mmol/L   Potassium 3.5 3.5 - 5.1 mmol/L   Chloride 103 98 - 111 mmol/L   CO2 23 22 - 32 mmol/L   Glucose, Bld 149 (H) 70 - 99 mg/dL   BUN 11 6 - 20 mg/dL   Creatinine, Ser 1.61 0.44 - 1.00 mg/dL   Calcium 8.6 (L) 8.9 - 10.3 mg/dL   Total Protein 7.7 6.5 - 8.1 g/dL   Albumin  4.2 3.5 - 5.0 g/dL   AST 17 15 - 41 U/L   ALT 17 0 - 44 U/L   Alkaline Phosphatase 70 38 - 126 U/L   Total Bilirubin 0.5 0.0 - 1.2 mg/dL   GFR, Estimated >09 >60 mL/min   Anion gap 6 5 - 15  CBC   Collection Time: 10/14/23  3:24 AM  Result Value Ref Range   WBC 9.8 4.0 - 10.5 K/uL   RBC 4.34 3.87 - 5.11 MIL/uL   Hemoglobin 12.2 12.0 - 15.0 g/dL   HCT 45.4 09.8 - 11.9 %   MCV 83.2 80.0 - 100.0 fL   MCH 28.1 26.0 - 34.0 pg   MCHC 33.8 30.0 - 36.0 g/dL   RDW 14.7 (H) 82.9 - 56.2 %   Platelets 401 (H) 150 - 400 K/uL   nRBC 0.0 0.0 - 0.2 %  hCG, quantitative, pregnancy   Collection Time: 10/14/23  3:24 AM  Result Value Ref Range   hCG, Beta Chain, Quant, S 2,347 (H) <5 mIU/mL  Type and screen [facility]   Collection Time: 10/14/23  3:24 AM  Result Value Ref Range   ABO/RH(D) O POS    Antibody Screen NEG    Sample Expiration      10/17/2023,2359 Performed at [facility], [address], [place], [place] [address]   Results for orders placed or performed during the hospital encounter of 10/03/23 (from the past 2 weeks)  hCG, quantitative, pregnancy   Collection Time: 10/03/23 10:45 AM  Result Value Ref Range   hCG, Beta Chain, Quant, S 8,840 (H) <5 mIU/mL  CBC   Collection Time: 10/03/23 10:45 AM  Result Value Ref Range   WBC 8.3 4.0 - 10.5 K/uL   RBC 4.30 3.87 - 5.11 MIL/uL   Hemoglobin 11.5 (L) 12.0 - 15.0 g/dL   HCT 57.8 (L) 46.9 - 62.9 %   MCV 83.0  80.0 - 100.0 fL   MCH 26.7 26.0 - 34.0 pg   MCHC 32.2 30.0 - 36.0 g/dL   RDW 52.8 41.3 - 24.4 %   Platelets 358 150 - 400 K/uL   nRBC 0.0 0.0 - 0.2 %  Comprehensive metabolic panel   Collection Time:  10/03/23 10:45 AM  Result Value Ref Range   Sodium 139 135 - 145 mmol/L   Potassium 3.9 3.5 - 5.1 mmol/L   Chloride 106 98 - 111 mmol/L   CO2 23 22 - 32 mmol/L   Glucose, Bld 106 (H) 70 - 99 mg/dL   BUN 7 6 - 20 mg/dL   Creatinine, Ser 9.60 0.44 - 1.00 mg/dL   Calcium 9.1 8.9 - 45.4 mg/dL   Total Protein 7.0 6.5 - 8.1 g/dL   Albumin  3.9 3.5 - 5.0 g/dL   AST 17 15 - 41 U/L   ALT 20 0 - 44 U/L   Alkaline Phosphatase 50 38 - 126 U/L   Total Bilirubin 0.6 0.0 - 1.2 mg/dL   GFR, Estimated >09 >81 mL/min   Anion gap 10 5 - 15  Results for orders placed or performed during the hospital encounter of 09/30/23 (from the past 2 weeks)  hCG, quantitative, pregnancy   Collection Time: 09/30/23  2:33 PM  Result Value Ref Range   hCG, Beta Chain, Quant, S 10,722 (H) <5 mIU/mL    Imaging Studies: US  OB Transvaginal Result Date: 09/29/2023 Table formatting from the original result was not included. Images from the original result were not included.  ..an Financial trader of Ultrasound Medicine Technical sales engineer) accredited practice Center for [facility] [address] [place] [address] Ordering Provider: Ozan, Jennifer, [place] DATING AND VIABILITY SONOGRAM Brooke Hunt is a 33 y.o. year old [id number] with LMP Patient's last menstrual period was 07/26/2023. which would correlate to  [redacted]w[redacted]d weeks gestation.  She has regular menstrual cycles.   She is here today for a confirmatory initial sonogram.  HISTORY OF 2 PREVIOUS ECTOPICS - LEFT SALPINGECTOMY MAY 2024 GESTATION: No evidence of intrauterine pregnancy - em thickness = 12.1 mm CERVIX: Appears closed ADNEXA: The ovaries are normal.  neg Left adnexal region Normal Right ovary with Live Right ectopic seen medial and adjacent to the Right  ovary. Size of entire tubal pregnancy - 29 x 25 x 23 mm  size of gestational sac = 9.3 mm = 5+5 weeks  CRL = 3.9 mm = 6+1 weeks slow faint heart motion seen.  (See cine clips) Tiny amount of clear free fluid within CDS, no free fluid seen elsewhere. GESTATIONAL AGE AND  BIOMETRICS: Gestational criteria: Estimated Date of Delivery: 05/01/24 by LMP now at [redacted]w[redacted]d Previous Scans:0 GESTATIONAL SAC           9.3 mm         5+5 weeks CROWN RUMP LENGTH           3.9 mm         6+1 weeks                                                                      AVERAGE EGA(BY THIS SCAN):  6+1 week Right ectopic with slow faint heart motion                                                  Size of entire tubal  pregnancy - 29 x 25 x 23 mm  TECHNICIAN COMMENTS: US : GA by LMP = 9+0 weeks Anteverted uterus normal in size, endometrial thickness = 12.1 mm, no evidence of IUP Normal Left ovary - neg Left adnexal region Normal Right ovary with Live Right ectopic seen medial and adjacent to the Right ovary. Size of entire tubal pregnancy - 29 x 25 x 23 mm  size of gestational sac = 9.3 mm = 5+5 weeks  CRL = 3.9 mm = 6+1 weeks slow faint heart motion seen.  (See cine clips) Tiny amount of clear free fluid within CDS, no free fluid seen elsewhere. A copy of this report including all images has been saved and backed up to a second source for retrieval if needed. All measures and details of the anatomical scan, placentation, fluid volume and pelvic anatomy are contained in that report. Brien Can 09/27/2023 5:27 PM IUP not visualized, thickened endometrium noted Right adnexa- ectopic pregnancy noted with cardiac activity measuring 2.9 x 2.5 x 2.3 cm Normal left ovary Very small free fluid noted with CDS On-call provider notified and patient sent to hospital for further evaluation and management Jennifer Ozan, DO Attending Obstetrician & Gynecologist, Faculty Practice Center for [facility], [facility] [place] [facility]   US  OB Comp Less 14  Wks Result Date: 09/29/2023 Table formatting from the original result was not included. Images from the original result were not included.  ..an Financial trader of Ultrasound Medicine Technical sales engineer) accredited practice Center for [facility] [address] [place] [address] Ordering Provider: Ozan, Jennifer, [place] DATING AND VIABILITY SONOGRAM Brooke Hunt is a 33 y.o. year old [id number] with LMP Patient's last menstrual period was 07/26/2023. which would correlate to  [redacted]w[redacted]d weeks gestation.  She has regular menstrual cycles.   She is here today for a confirmatory initial sonogram.  HISTORY OF 2 PREVIOUS ECTOPICS - LEFT SALPINGECTOMY MAY 2024 GESTATION: No evidence of intrauterine pregnancy - em thickness = 12.1 mm CERVIX: Appears closed ADNEXA: The ovaries are normal.  neg Left adnexal region Normal Right ovary with Live Right ectopic seen medial and adjacent to the Right ovary. Size of entire tubal pregnancy - 29 x 25 x 23 mm  size of gestational sac = 9.3 mm = 5+5 weeks  CRL = 3.9 mm = 6+1 weeks slow faint heart motion seen.  (See cine clips) Tiny amount of clear free fluid within CDS, no free fluid seen elsewhere. GESTATIONAL AGE AND  BIOMETRICS: Gestational criteria: Estimated Date of Delivery: 05/01/24 by LMP now at [redacted]w[redacted]d Previous Scans:0 GESTATIONAL SAC           9.3 mm         5+5 weeks CROWN RUMP LENGTH           3.9 mm         6+1 weeks                                                                      AVERAGE EGA(BY THIS SCAN):  6+1 week Right ectopic with slow faint heart motion  Size of entire tubal pregnancy - 29 x 25 x 23 mm  TECHNICIAN COMMENTS: US : GA by LMP = 9+0 weeks Anteverted uterus normal in size, endometrial thickness = 12.1 mm, no evidence of IUP Normal Left ovary - neg Left adnexal region Normal Right ovary with Live Right ectopic seen medial and adjacent to the Right ovary. Size of entire tubal pregnancy - 29 x 25 x 23 mm   size of gestational sac = 9.3 mm = 5+5 weeks  CRL = 3.9 mm = 6+1 weeks slow faint heart motion seen.  (See cine clips) Tiny amount of clear free fluid within CDS, no free fluid seen elsewhere. A copy of this report including all images has been saved and backed up to a second source for retrieval if needed. All measures and details of the anatomical scan, placentation, fluid volume and pelvic anatomy are contained in that report. Brien Can 09/27/2023 5:27 PM IUP not visualized, thickened endometrium noted Right adnexa- ectopic pregnancy noted with cardiac activity measuring 2.9 x 2.5 x 2.3 cm Normal left ovary Very small free fluid noted with CDS On-call provider notified and patient sent to hospital for further evaluation and management Jennifer Ozan, DO Attending Obstetrician & Gynecologist, Faculty Practice Center for [facility], [facility]    Assessment: Active Problems:   Ruptured ectopic pregnancy   Plan: - Diagnosis: Right ruptured ectopic pregnancy - Planned surgery: Laparoscopic right salpingectomy  - Risks of surgery include but are not limited to: bleeding, infection, injury to surrounding organs/tissues (i.e. bowel/bladder/ureters), need for additional procedures, wound complications, hospital re-admission, and conversion to open surgery, VTE - We discussed postop restrictions, precautions and expectations - Preop testing needed: T&S but we will not wait for it to proceed with surgery.  - All questions answered    Lacey Pian, MD, FACOG Obstetrician & Gynecologist, [facility] for [facility], [facility]

## 2023-10-14 NOTE — ED Notes (Signed)
 [place] at bedside

## 2023-10-14 NOTE — ED Notes (Signed)
 ED Provider at bedside.

## 2023-10-14 NOTE — Transfer of Care (Signed)
 Immediate Anesthesia Transfer of Care Note  Patient: Brooke Hunt  Procedure(s) Performed: LAPAROSCOPY, WITH ECTOPIC PREGNANCY SURGICAL TREATMENT/ SALPINGECTOMY (Pelvis)  Patient Location: PACU  Anesthesia Type:General  Level of Consciousness: awake, alert , and sedated  Airway & Oxygen Therapy: Patient Spontanous Breathing and Patient connected to face mask oxygen  Post-op Assessment: Report given to RN and Post -op Vital signs reviewed and stable  Post vital signs: Reviewed and stable  Last Vitals:  Vitals Value Taken Time  BP 161/100   Temp 36.3 C 10/14/23 1008  Pulse 89 10/14/23 1010  Resp 25 10/14/23 1010  SpO2 98 % 10/14/23 1010  Vitals shown include unfiled device data.  Last Pain:  Vitals:   10/14/23 0802  TempSrc: Oral  PainSc:          Complications: No notable events documented.

## 2023-10-14 NOTE — Op Note (Addendum)
 Rosia Cook PROCEDURE DATE: 10/14/2023  PREOPERATIVE DIAGNOSIS: Ruptured ectopic pregnancy POSTOPERATIVE DIAGNOSIS: Ruptured right fallopian tube ectopic pregnancy PROCEDURE: Laparoscopic right salpingectomy and removal of ectopic pregnancy SURGEON:  Dr. Verlyn Goad ASSISTANT: none ANESTHESIOLOGIST: Earvin Goldberg, MD Anesthesiologist: Earvin Goldberg, MD CRNA: Bennett Brass, CRNA; Thena Fireman, CRNA  INDICATIONS: 33 y.o. [phone] at [redacted]w[redacted]d here for with ruptured ectopic pregnancy. On exam, she had stable vital signs, and an acute abdomen. Hgb  Lab Results  Component Value Date   HGB 12.2 10/14/2023   , blood type O POS. Patient was counseled regarding need for laparoscopic salpingectomy. Risks of surgery including bleeding which may require transfusion or reoperation, infection, injury to bowel or other surrounding organs, need for additional procedures including laparotomy and other postoperative/anesthesia complications were explained to patient.  Written informed consent was obtained.  FINDINGS:  Moderate amount of hemoperitoneum estimated to be about 300 of blood and clots.  Dilated right fallopian tube containing ectopic gestation. Small normal appearing uterus, Left fallopian tube previously surgically removed, normal right and left ovary. Adhesions noted between liver and anterior abdominal wall  ANESTHESIA: General INTRAVENOUS FLUIDS: .900 ml ESTIMATED BLOOD LOSS:  30 mL  URINE OUTPUT: 100 ml SPECIMENS: right fallopian tube containing ectopic gestation COMPLICATIONS: None immediate  PROCEDURE IN DETAIL:  The patient was taken to the operating room where general anesthesia was administered and was found to be adequate.  She was placed in the dorsal lithotomy position, and was prepped and draped in a sterile manner.  A Foley catheter was inserted into her bladder and attached to Sritha Chauncey drainage and a uterine manipulator was then advanced into the uterus .  After an  adequate timeout was performed, attention was then turned to the patient's abdomen where a 10-mm skin incision was made on the umbilical fold.  The fascia was identified, tented up and incised with Mayo scissors. The fascia was tagged with 0- Vicryl. The peritoneum was identified tented up and entered sharply with Metzenbaum scissors.  10-mm trocar and sleeve were then advanced without difficulty into the abdomen where intraabdominal placement was confirmed by the laparoscope. Pneumoperitoneum was achieved with insufflation of CO2 gas. A survey of the patient's pelvis and abdomen revealed the findings as above.  Two 5-mm left lower quadrant ports were placed under direct visualization.  The 5-mm Nezhat suction irrigator was then used to suction the hemoperitoneum and irrigate the pelvis.  Attention was then turned to the right fallopian tube which was grasped and ligated from the underlying mesosalpinx and uterine attachment using the Harmonic instrument.  Good hemostasis was noted.  The specimen was placed in an EndoCatch bag and removed from the abdomen intact.  The abdomen was desufflated, and all instruments were removed.  The fascial incision at the 10-mm site was reapproximated with 0 Vicryl figure-of-eight stiches; and all skin incisions were closed with a 3-0 Vicryl subcuticular stitch. The patient tolerated the procedures well.  All instruments, needles, and sponge counts were correct x 2. The patient was taken to the recovery room in stable condition.   The patient will be discharged to home as per PACU criteria.  Routine postoperative instructions given.  Patient requested to start antidepressant.

## 2023-10-14 NOTE — Anesthesia Preprocedure Evaluation (Signed)
 Anesthesia Evaluation  Patient identified by MRN, date of birth, ID band Patient awake    Reviewed: Allergy & Precautions, H&P , NPO status , Patient's Chart, lab work & pertinent test results  Airway Mallampati: II  TM Distance: >3 FB Neck ROM: Full    Dental no notable dental hx.    Pulmonary neg pulmonary ROS, Patient abstained from smoking., former smoker   Pulmonary exam normal        Cardiovascular negative cardio ROS  Rhythm:Regular Rate:Normal     Neuro/Psych negative neurological ROS  negative psych ROS   GI/Hepatic negative GI ROS, Neg liver ROS,,,  Endo/Other  negative endocrine ROS    Renal/GU negative Renal ROS  negative genitourinary   Musculoskeletal negative musculoskeletal ROS (+)    Abdominal Normal abdominal exam  (+)   Peds negative pediatric ROS (+)  Hematology negative hematology ROS (+)   Anesthesia Other Findings   Reproductive/Obstetrics negative OB ROS                             Anesthesia Physical Anesthesia Plan  ASA: 2 and emergent  Anesthesia Plan: General   Post-op Pain Management:    Induction: Intravenous, Rapid sequence and Cricoid pressure planned  PONV Risk Score and Plan: 3 and Ondansetron , Dexamethasone , Midazolam  and Treatment may vary due to age or medical condition  Airway Management Planned: Oral ETT  Additional Equipment: None  Intra-op Plan:   Post-operative Plan: Extubation in OR  Informed Consent: I have reviewed the patients History and Physical, chart, labs and discussed the procedure including the risks, benefits and alternatives for the proposed anesthesia with the patient or authorized representative who has indicated his/her understanding and acceptance.     Dental advisory given  Plan Discussed with: CRNA  Anesthesia Plan Comments:        Anesthesia Quick Evaluation

## 2023-10-14 NOTE — ED Provider Notes (Signed)
 [place] EMERGENCY DEPARTMENT AT [facility] Provider Note   CSN: [id number] Arrival date & time: 10/14/23  4098     History  Chief Complaint  Patient presents with   Abdominal Pain    Brooke Hunt is a 33 y.o. female.  The history is provided by the patient.  Patient with previous history of ruptured left ectopic pregnancy presents with abrupt onset of abdominal pain over 10 hours ago. Patient reports she was recently diagnosed with a right ectopic pregnancy and was given methotrexate  earlier this month.  She had been doing well until yesterday.  No fevers or vomiting.  No vaginal bleeding.  She reports the pain is worse with any movement Patient reports she has previous history of left ectopic pregnancy that required operative management (salpingectomy)   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   Ruptured left tubal ectopic pregnancy causing hemoperitoneum 08/12/2022    Home Medications Prior to Admission medications   Not on File      Allergies    Shellfish-derived products    Review of Systems   Review of Systems  Constitutional:  Negative for fever.  Gastrointestinal:  Positive for abdominal pain.  Genitourinary:  Negative for vaginal bleeding.    Physical Exam Updated Vital Signs BP 123/75   Pulse 67   Temp 98 F (36.7 C) (Oral)   Resp 16   Ht 1.6 m (5\' 3" )   Wt 93 kg   LMP 07/26/2023   SpO2 96%   BMI 36.31 kg/m  Physical Exam CONSTITUTIONAL: Well developed/well nourished, uncomfortable appearing HEAD: Normocephalic/atraumatic ENMT: Mucous membranes moist NECK: supple no meningeal signs CV: S1/S2 noted, no murmurs/rubs/gallops noted LUNGS: Lungs are clear to auscultation bilaterally, no apparent distress ABDOMEN: soft, diffuse moderate right-sided abdominal tenderness, no rebound or guarding, bowel sounds noted throughout abdomen GU:no cva  tenderness NEURO: Pt is awake/alert/appropriate, moves all extremitiesx4.  No facial droop.   EXTREMITIES:  full ROM SKIN: warm, color normal PSYCH: Anxious ED Results / Procedures / Treatments   Labs (all labs ordered are listed, but only abnormal results are displayed) Labs Reviewed  COMPREHENSIVE METABOLIC PANEL WITH GFR - Abnormal; Notable for the following components:      Result Value   Sodium 132 (*)    Glucose, Bld 149 (*)    Calcium 8.6 (*)    All other components within normal limits  CBC - Abnormal; Notable for the following components:   RDW 15.9 (*)    Platelets 401 (*)    All other components within normal limits  HCG, QUANTITATIVE, PREGNANCY - Abnormal; Notable for the following components:   hCG, Beta Chain, Quant, S 2,347 (*)    All other components within normal limits  LIPASE, BLOOD  TYPE AND SCREEN    EKG None  Radiology No results found.  Procedures Ultrasound ED FAST  Date/Time: 10/14/2023 3:52 AM  Performed by: Eldon Greenland, MD Authorized by: Eldon Greenland, MD  Procedure details:     Indications comment:  Abdominal pain    Assess for:  Intra-abdominal fluid    Technique:  Abdominal    Images: archived    Study Limitations: body habitus, patient compliance and bowel gas  Abdominal findings:    L kidney:  Not visualized   R kidney:  Visualized   Liver:  Visualized    Bladder:  Not visualized Comments:  Limited bedside ultrasound performed to evaluate for any peritoneal fluid with history of recent ectopic pregnancy.  Exam was very limited due to patient compliance.  No obvious free fluid was identified .Critical Care  Performed by: Eldon Greenland, MD Authorized by: Eldon Greenland, MD   Critical care provider statement:    Critical care time (minutes):  60   Critical care start time:  10/14/2023 5:00 AM   Critical care end time:  10/14/2023 6:00 AM   Critical care time was exclusive of:  Separately billable procedures and  treating other patients   Critical care was necessary to treat or prevent imminent or life-threatening deterioration of the following conditions:  Shock and sepsis   Critical care was time spent personally by me on the following activities:  Examination of patient, evaluation of patient's response to treatment, re-evaluation of patient's condition, ordering and review of radiographic studies, ordering and review of laboratory studies and discussions with consultants   I assumed direction of critical care for this patient from another provider in my specialty: no     Care discussed with: admitting provider       Medications Ordered in ED Medications  fentaNYL  (SUBLIMAZE ) injection 100 mcg (100 mcg Intravenous Given 10/14/23 0344)  ondansetron  (ZOFRAN ) injection 4 mg (4 mg Intravenous Given 10/14/23 0344)  fentaNYL  (SUBLIMAZE ) injection 100 mcg (100 mcg Intravenous Given 10/14/23 0436)  fentaNYL  (SUBLIMAZE ) injection 100 mcg (100 mcg Intravenous Given 10/14/23 0534)    ED Course/ Medical Decision Making/ A&P Clinical Course as of 10/14/23 4098  Thu Oct 14, 2023  0349 Patient presents with abrupt onset of abdominal pain in the setting of a recent right ectopic pregnancy that was treated with methotrexate  Patient is hemodynamically appropriate but does appear uncomfortable  I discussed the case with on-call OB/GYN Dr. Vallarie Gauze.  She has reviewed all the records, recommends formal OB ultrasound and to call her back.  Ectopic rupture this late would be unusual but is possible [DW]  0536 US  complete. Awaiting results, message sent to OBGYN  [DW]  0606 Discussed ultrasound findings with Dr. Vallarie Gauze.  It appears the patient has ruptured ectopic.  She will need to be transferred to the [facility]. Patient is currently hemodynamically stable. [DW]    Clinical Course User Index [DW] Eldon Greenland, MD                                 Medical Decision Making Amount and/or Complexity of Data  Reviewed Labs: ordered. Radiology: ordered.  Risk Prescription drug management. Decision regarding hospitalization.   This patient presents to the ED for concern of abdominal pain, this involves an extensive number of treatment options, and is a complaint that carries with it a high risk of complications and morbidity.  The differential diagnosis includes but is not limited to cholecystitis, cholelithiasis, pancreatitis, gastritis, peptic ulcer disease, appendicitis, bowel obstruction, bowel perforation, diverticulitis, ectopic pregnancy, PID, TOA, Ovarian torsion   Comorbidities that complicate the patient evaluation: Patient's presentation is complicated by their history of previous ectopic pregnancy  Additional history obtained: Records reviewed previous admission documents  Lab Tests: I Ordered, and personally interpreted labs.  The pertinent results include: Elevated hCG  Imaging Studies ordered: I ordered imaging studies including Ultrasound obstetric  I independently visualized and interpreted imaging which showed ruptured ectopic  I agree with the radiologist interpretation  Medicines ordered and prescription drug management: I ordered medication including fentanyl  for  pain Reevaluation of the patient after these medicines showed that the patient    improved  Critical Interventions:   transfer for surgery with gynecology  Consultations Obtained: I requested consultation with the admitting physician OB/GYN, and discussed  findings as well as pertinent plan - they recommend: Transfer for operative management  Reevaluation: After the interventions noted above, I reevaluated the patient and found that they have :improved  Complexity of problems addressed: Patient's presentation is most consistent with  acute presentation with potential threat to life or bodily function  Disposition: After consideration of the diagnostic results and the patient's response to treatment,   I feel that the patent would benefit from admission  .           Final Clinical Impression(s) / ED Diagnoses Final diagnoses:  Ruptured ectopic pregnancy    Rx / DC Orders ED Discharge Orders     None         Eldon Greenland, MD 10/14/23 [phone]

## 2023-10-14 NOTE — Progress Notes (Signed)
 Patient marked on the right side.   Lacey Pian, MD Attending Obstetrician & Gynecologist, [facility] for [facility], [facility]

## 2023-10-14 NOTE — Anesthesia Procedure Notes (Signed)
 Procedure Name: Intubation Date/Time: 10/14/2023 8:40 AM  Performed by: Tyaire Odem C, CRNAPre-anesthesia Checklist: Patient identified, Emergency Drugs available, Suction available and Patient being monitored Patient Re-evaluated:Patient Re-evaluated prior to induction Oxygen Delivery Method: Circle system utilized Preoxygenation: Pre-oxygenation with 100% oxygen Induction Type: IV induction Ventilation: Mask ventilation without difficulty Laryngoscope Size: Mac and 3 Grade View: Grade II Tube type: Oral Tube size: 7.0 mm Number of attempts: 1 Airway Equipment and Method: Stylet and Oral airway Placement Confirmation: ETT inserted through vocal cords under direct vision, positive ETCO2 and breath sounds checked- equal and bilateral Secured at: 21 cm Tube secured with: Tape Dental Injury: Teeth and Oropharynx as per pre-operative assessment

## 2023-10-14 NOTE — ED Triage Notes (Addendum)
 Pt to ed via [facility] from home c/o dx ectopic pregnancy 5/5. Pt has received 2 doses of methotrexate . Pt states bleeding stopped 2 days ago. Pt is diaphoretic and endorses abdominal pain. Pt states feels just like her previous ectopic pregnancy.

## 2023-10-15 ENCOUNTER — Encounter (HOSPITAL_COMMUNITY): Payer: Self-pay | Admitting: Obstetrics and Gynecology

## 2023-10-15 LAB — SURGICAL PATHOLOGY

## 2023-10-15 NOTE — Anesthesia Postprocedure Evaluation (Signed)
 Anesthesia Post Note  Patient: TANGANIKA BARRADAS  Procedure(s) Performed: LAPAROSCOPY, WITH ECTOPIC PREGNANCY SURGICAL TREATMENT/ SALPINGECTOMY (Pelvis)     Patient location during evaluation: PACU Anesthesia Type: General Level of consciousness: awake and alert Pain management: pain level controlled Vital Signs Assessment: post-procedure vital signs reviewed and stable Respiratory status: spontaneous breathing, nonlabored ventilation and respiratory function stable Cardiovascular status: blood pressure returned to baseline and stable Postop Assessment: no apparent nausea or vomiting Anesthetic complications: no   No notable events documented.  Last Vitals:  Vitals:   10/14/23 1030 10/14/23 1033  BP:  119/80  Pulse:    Resp:    Temp:    SpO2: 96%     Last Pain:  Vitals:   10/14/23 1045  TempSrc:   PainSc: 8                  Earvin Goldberg

## 2023-10-28 ENCOUNTER — Encounter: Admitting: Obstetrics and Gynecology

## 2023-11-04 DIAGNOSIS — Z419 Encounter for procedure for purposes other than remedying health state, unspecified: Secondary | ICD-10-CM | POA: Diagnosis not present

## 2023-12-04 DIAGNOSIS — Z419 Encounter for procedure for purposes other than remedying health state, unspecified: Secondary | ICD-10-CM | POA: Diagnosis not present

## 2024-01-04 DIAGNOSIS — Z419 Encounter for procedure for purposes other than remedying health state, unspecified: Secondary | ICD-10-CM | POA: Diagnosis not present

## 2024-02-04 DIAGNOSIS — Z419 Encounter for procedure for purposes other than remedying health state, unspecified: Secondary | ICD-10-CM | POA: Diagnosis not present

## 2024-03-05 DIAGNOSIS — Z419 Encounter for procedure for purposes other than remedying health state, unspecified: Secondary | ICD-10-CM | POA: Diagnosis not present

## 2024-06-28 ENCOUNTER — Encounter: Payer: Self-pay | Admitting: Adult Health

## 2024-10-30 ENCOUNTER — Ambulatory Visit: Admitting: Adult Health
